# Patient Record
Sex: Female | Born: 1950
Health system: Southern US, Community
[De-identification: ages and names within clinical notes are randomized; demographics above are authoritative.]

## PROBLEM LIST (undated history)

## (undated) DIAGNOSIS — T7840XA Allergy, unspecified, initial encounter: Secondary | ICD-10-CM

## (undated) DIAGNOSIS — E785 Hyperlipidemia, unspecified: Secondary | ICD-10-CM

## (undated) HISTORY — PX: TUBAL LIGATION: SHX77

## (undated) HISTORY — PX: TOTAL ABDOMINAL HYSTERECTOMY: SHX209

## (undated) HISTORY — PX: EYE SURGERY: SHX253

## (undated) HISTORY — DX: Hyperlipidemia, unspecified: E78.5

## (undated) HISTORY — PX: COLONOSCOPY: SHX174

## (undated) HISTORY — DX: Allergy, unspecified, initial encounter: T78.40XA

---

## 1999-02-23 ENCOUNTER — Emergency Department (HOSPITAL_COMMUNITY): Admission: EM | Admit: 1999-02-23 | Discharge: 1999-02-23 | Payer: Self-pay | Admitting: Emergency Medicine

## 1999-02-23 ENCOUNTER — Encounter: Payer: Self-pay | Admitting: Emergency Medicine

## 1999-02-24 ENCOUNTER — Encounter: Payer: Self-pay | Admitting: Emergency Medicine

## 1999-02-24 ENCOUNTER — Ambulatory Visit (HOSPITAL_COMMUNITY): Admission: RE | Admit: 1999-02-24 | Discharge: 1999-02-24 | Payer: Self-pay | Admitting: Emergency Medicine

## 1999-02-24 ENCOUNTER — Emergency Department (HOSPITAL_COMMUNITY): Admission: EM | Admit: 1999-02-24 | Discharge: 1999-02-24 | Payer: Self-pay | Admitting: Emergency Medicine

## 1999-12-16 ENCOUNTER — Other Ambulatory Visit: Admission: RE | Admit: 1999-12-16 | Discharge: 1999-12-16 | Payer: Self-pay | Admitting: Obstetrics and Gynecology

## 2001-03-30 ENCOUNTER — Other Ambulatory Visit: Admission: RE | Admit: 2001-03-30 | Discharge: 2001-03-30 | Payer: Self-pay | Admitting: Obstetrics and Gynecology

## 2004-09-25 ENCOUNTER — Emergency Department (HOSPITAL_COMMUNITY): Admission: EM | Admit: 2004-09-25 | Discharge: 2004-09-25 | Payer: Self-pay | Admitting: Emergency Medicine

## 2005-08-09 ENCOUNTER — Emergency Department (HOSPITAL_COMMUNITY): Admission: EM | Admit: 2005-08-09 | Discharge: 2005-08-09 | Payer: Self-pay | Admitting: Family Medicine

## 2005-12-22 ENCOUNTER — Ambulatory Visit: Payer: Self-pay | Admitting: Gastroenterology

## 2006-01-04 ENCOUNTER — Ambulatory Visit: Payer: Self-pay | Admitting: Gastroenterology

## 2006-01-04 ENCOUNTER — Encounter (INDEPENDENT_AMBULATORY_CARE_PROVIDER_SITE_OTHER): Payer: Self-pay | Admitting: *Deleted

## 2006-12-30 ENCOUNTER — Encounter: Admission: RE | Admit: 2006-12-30 | Discharge: 2006-12-30 | Payer: Self-pay | Admitting: Internal Medicine

## 2007-06-02 ENCOUNTER — Emergency Department (HOSPITAL_COMMUNITY): Admission: EM | Admit: 2007-06-02 | Discharge: 2007-06-02 | Payer: Self-pay | Admitting: Family Medicine

## 2008-01-03 ENCOUNTER — Emergency Department (HOSPITAL_COMMUNITY): Admission: EM | Admit: 2008-01-03 | Discharge: 2008-01-03 | Payer: Self-pay | Admitting: Family Medicine

## 2008-01-04 ENCOUNTER — Emergency Department (HOSPITAL_COMMUNITY): Admission: EM | Admit: 2008-01-04 | Discharge: 2008-01-04 | Payer: Self-pay | Admitting: Emergency Medicine

## 2008-05-28 ENCOUNTER — Encounter: Admission: RE | Admit: 2008-05-28 | Discharge: 2008-05-28 | Payer: Self-pay | Admitting: Internal Medicine

## 2009-06-03 ENCOUNTER — Encounter: Admission: RE | Admit: 2009-06-03 | Discharge: 2009-06-03 | Payer: Self-pay | Admitting: Internal Medicine

## 2010-06-04 ENCOUNTER — Encounter: Admission: RE | Admit: 2010-06-04 | Discharge: 2010-06-04 | Payer: Self-pay | Admitting: Internal Medicine

## 2011-02-10 ENCOUNTER — Encounter: Payer: Self-pay | Admitting: Gastroenterology

## 2011-05-18 ENCOUNTER — Other Ambulatory Visit: Payer: Self-pay | Admitting: Internal Medicine

## 2011-05-18 DIAGNOSIS — Z1231 Encounter for screening mammogram for malignant neoplasm of breast: Secondary | ICD-10-CM

## 2011-06-09 ENCOUNTER — Ambulatory Visit
Admission: RE | Admit: 2011-06-09 | Discharge: 2011-06-09 | Disposition: A | Discharge status: Home / Self Care | Payer: Managed Care, Other (non HMO) | Source: Ambulatory Visit | Attending: Internal Medicine | Admitting: Internal Medicine

## 2011-06-09 DIAGNOSIS — Z1231 Encounter for screening mammogram for malignant neoplasm of breast: Secondary | ICD-10-CM

## 2012-08-01 ENCOUNTER — Other Ambulatory Visit: Payer: Self-pay | Admitting: Internal Medicine

## 2012-08-01 DIAGNOSIS — Z1231 Encounter for screening mammogram for malignant neoplasm of breast: Secondary | ICD-10-CM

## 2012-08-18 ENCOUNTER — Ambulatory Visit
Admission: RE | Admit: 2012-08-18 | Discharge: 2012-08-18 | Disposition: A | Discharge status: Home / Self Care | Payer: 59 | Source: Ambulatory Visit | Attending: Internal Medicine | Admitting: Internal Medicine

## 2012-08-18 DIAGNOSIS — Z1231 Encounter for screening mammogram for malignant neoplasm of breast: Secondary | ICD-10-CM

## 2013-09-29 ENCOUNTER — Other Ambulatory Visit: Payer: Self-pay

## 2013-09-29 DIAGNOSIS — Z1231 Encounter for screening mammogram for malignant neoplasm of breast: Secondary | ICD-10-CM

## 2013-10-13 ENCOUNTER — Ambulatory Visit
Admission: RE | Admit: 2013-10-13 | Discharge: 2013-10-13 | Disposition: A | Discharge status: Home / Self Care | Payer: 59 | Source: Ambulatory Visit

## 2013-10-13 DIAGNOSIS — Z1231 Encounter for screening mammogram for malignant neoplasm of breast: Secondary | ICD-10-CM

## 2014-11-23 ENCOUNTER — Other Ambulatory Visit: Payer: Self-pay

## 2014-11-23 DIAGNOSIS — Z1231 Encounter for screening mammogram for malignant neoplasm of breast: Secondary | ICD-10-CM

## 2014-11-28 ENCOUNTER — Ambulatory Visit
Admission: RE | Admit: 2014-11-28 | Discharge: 2014-11-28 | Disposition: A | Discharge status: Home / Self Care | Payer: 59 | Source: Ambulatory Visit

## 2014-11-28 DIAGNOSIS — Z1231 Encounter for screening mammogram for malignant neoplasm of breast: Secondary | ICD-10-CM

## 2015-12-11 ENCOUNTER — Encounter: Payer: Self-pay | Admitting: Gastroenterology

## 2015-12-16 ENCOUNTER — Other Ambulatory Visit: Payer: Self-pay

## 2015-12-16 DIAGNOSIS — Z1231 Encounter for screening mammogram for malignant neoplasm of breast: Secondary | ICD-10-CM

## 2015-12-24 ENCOUNTER — Ambulatory Visit
Admission: RE | Admit: 2015-12-24 | Discharge: 2015-12-24 | Disposition: A | Discharge status: Home / Self Care | Payer: 59 | Source: Ambulatory Visit

## 2015-12-24 DIAGNOSIS — Z1231 Encounter for screening mammogram for malignant neoplasm of breast: Secondary | ICD-10-CM

## 2016-01-10 ENCOUNTER — Encounter: Payer: Self-pay | Admitting: Gastroenterology

## 2016-03-09 ENCOUNTER — Ambulatory Visit (AMBULATORY_SURGERY_CENTER): Payer: Self-pay

## 2016-03-09 ENCOUNTER — Encounter: Payer: Self-pay | Admitting: Gastroenterology

## 2016-03-09 ENCOUNTER — Encounter (INDEPENDENT_AMBULATORY_CARE_PROVIDER_SITE_OTHER): Payer: Self-pay

## 2016-03-09 VITALS — Ht 60.0 in | Wt 116.2 lb

## 2016-03-09 DIAGNOSIS — Z1211 Encounter for screening for malignant neoplasm of colon: Secondary | ICD-10-CM

## 2016-03-09 MED ORDER — SUPREP BOWEL PREP KIT 17.5-3.13-1.6 GM/177ML PO SOLN: 1.0000 | Freq: Once | ORAL | 0 refills | Status: AC

## 2016-03-23 ENCOUNTER — Encounter: Payer: Self-pay | Admitting: Gastroenterology

## 2016-03-23 ENCOUNTER — Ambulatory Visit (AMBULATORY_SURGERY_CENTER): Payer: 59 | Admitting: Gastroenterology

## 2016-03-23 VITALS — BP 123/47 | HR 76 | Temp 98.0°F | Resp 18 | Ht 60.0 in | Wt 116.0 lb

## 2016-03-23 DIAGNOSIS — Z1211 Encounter for screening for malignant neoplasm of colon: Secondary | ICD-10-CM

## 2016-03-23 MED ORDER — SODIUM CHLORIDE 0.9 % IV SOLN: 500.0000 mL | INTRAVENOUS | Status: AC

## 2016-03-23 NOTE — Op Note (Signed)
[facility] Patient Name: Tiffany Simpson Procedure Date: 03/23/2016 9:27 AM MRN: [id number] Endoscopist: Sherilyn Cooter L. Myrtie Neither , MD Age: 65 Referring MD:  Date of Birth: Oct 16, 1950 Gender: Female Account #: [id number] Procedure:                Colonoscopy Indications:              Screening for colorectal malignant neoplasm Medicines:                Monitored Anesthesia Care Procedure:                Pre-Anesthesia Assessment:                           - Prior to the procedure, a History and Physical                            was performed, and patient medications and                            allergies were reviewed. The patient's tolerance of                            previous anesthesia was also reviewed. The risks                            and benefits of the procedure and the sedation                            options and risks were discussed with the patient.                            All questions were answered, and informed consent                            was obtained. Prior Anticoagulants: The patient has                            taken no previous anticoagulant or antiplatelet                            agents. ASA Grade Assessment: II - A patient with                            mild systemic disease. After reviewing the risks                            and benefits, the patient was deemed in                            satisfactory condition to undergo the procedure.                           After obtaining informed consent, the colonoscope  was passed under direct vision. Throughout the                            procedure, the patient's blood pressure, pulse, and                            oxygen saturations were monitored continuously. The                            Model PCF-H190DL [id number]) scope was introduced                            through the anus and advanced to the the cecum,                            identified by  appendiceal orifice and ileocecal                            valve. The colonoscopy was somewhat difficult due                            to a tortuous colon. The patient tolerated the                            procedure well. The quality of the bowel                            preparation was excellent. The ileocecal valve,                            appendiceal orifice, and rectum were photographed.                            The quality of the bowel preparation was evaluated                            using the BBPS [facility] Bowel Preparation Scale)                            with scores of: Right Colon = 3, Transverse Colon =                            3 and Left Colon = 3 (entire mucosa seen well with                            no residual staining, small fragments of stool or                            opaque liquid). The total BBPS score equals 9. The                            bowel preparation used was SUPREP. Scope In: 9:45:56 AM Scope Out: 9:58:25  AM Scope Withdrawal Time: 0 hours 7 minutes 14 seconds  Total Procedure Duration: 0 hours 12 minutes 29 seconds  Findings:                 The perianal and digital rectal examinations were                            normal.                           Multiple small-mouthed diverticula were found in                            the left colon.                           Internal hemorrhoids were found during anoscopy.                            The hemorrhoids were small and Grade I (internal                            hemorrhoids that do not prolapse).                           Retroflexion in the rectum was not performed due to                            anatomy.                           The exam was otherwise without abnormality. Complications:            No immediate complications. Estimated Blood Loss:     Estimated blood loss: none. Impression:               - Diverticulosis in the left colon.                           - Internal  hemorrhoids.                           - The examination was otherwise normal.                           - No specimens collected. Recommendation:           - Patient has a contact number available for                            emergencies. The signs and symptoms of potential                            delayed complications were discussed with the                            patient. Return to normal activities tomorrow.  Written discharge instructions were provided to the                            patient.                           - Resume previous diet.                           - Continue present medications.                           - Repeat colonoscopy in 10 years for screening                            purposes. Henry L. Myrtie Neitheranis, MD 03/23/2016 10:02:20 AM This report has been signed electronically.

## 2016-03-23 NOTE — Progress Notes (Signed)
A/ox3, pleased with MAC, report to RN 

## 2016-03-23 NOTE — Patient Instructions (Signed)
Impression/recommendations:  Diverticulosis (handout given) High Fiber diet (handout given) Hemorrhoids (handout given)  YOU HAD AN ENDOSCOPIC PROCEDURE TODAY AT THE [facility]:   Refer to the procedure report that was given to you for any specific questions about what was found during the examination.  If the procedure report does not answer your questions, please call your gastroenterologist to clarify.  If you requested that your care partner not be given the details of your procedure findings, then the procedure report has been included in a sealed envelope for you to review at your convenience later.  YOU SHOULD EXPECT: Some feelings of bloating in the abdomen. Passage of more gas than usual.  Walking can help get rid of the air that was put into your GI tract during the procedure and reduce the bloating. If you had a lower endoscopy (such as a colonoscopy or flexible sigmoidoscopy) you may notice spotting of blood in your stool or on the toilet paper. If you underwent a bowel prep for your procedure, you may not have a normal bowel movement for a few days.  Please Note:  You might notice some irritation and congestion in your nose or some drainage.  This is from the oxygen used during your procedure.  There is no need for concern and it should clear up in a day or so.  SYMPTOMS TO REPORT IMMEDIATELY:   Following lower endoscopy (colonoscopy or flexible sigmoidoscopy):  Excessive amounts of blood in the stool  Significant tenderness or worsening of abdominal pains  Swelling of the abdomen that is new, acute  Fever of 100F or higher  For urgent or emergent issues, a gastroenterologist can be reached at any hour by calling [phone].   DIET:  We do recommend a small meal at first, but then you may proceed to your regular diet.  Drink plenty of fluids but you should avoid alcoholic beverages for 24 hours.  ACTIVITY:  You should plan to take it easy for the rest of  today and you should NOT DRIVE or use heavy machinery until tomorrow (because of the sedation medicines used during the test).    FOLLOW UP: Our staff will call the number listed on your records the next business day following your procedure to check on you and address any questions or concerns that you may have regarding the information given to you following your procedure. If we do not reach you, we will leave a message.  However, if you are feeling well and you are not experiencing any problems, there is no need to return our call.  We will assume that you have returned to your regular daily activities without incident.  If any biopsies were taken you will be contacted by phone or by letter within the next 1-3 weeks.  Please call us at [phone] if you have not heard about the biopsies in 3 weeks.    SIGNATURES/CONFIDENTIALITY: You and/or your care partner have signed paperwork which will be entered into your electronic medical record.  These signatures attest to the fact that that the information above on your After Visit Summary has been reviewed and is understood.  Full responsibility of the confidentiality of this discharge information lies with you and/or your care-partner. 

## 2016-03-24 ENCOUNTER — Telehealth: Payer: Self-pay

## 2016-03-24 NOTE — Telephone Encounter (Signed)
  Follow up Call-  Call back number 03/23/2016  Post procedure Call Back phone  # [phone]  Permission to leave phone message Yes  Some recent data might be hidden     Patient questions:  Do you have a fever, pain , or abdominal swelling? Yes.   Pain Score  0 *  Have you tolerated food without any problems? Yes.    Have you been able to return to your normal activities? Yes.    Do you have any questions about your discharge instructions: Diet   No. Medications  No. Follow up visit  No.  Do you have questions or concerns about your Care? No.  Actions: * If pain score is 4 or above: No action needed, pain <4.

## 2016-04-10 DIAGNOSIS — M810 Age-related osteoporosis without current pathological fracture: Secondary | ICD-10-CM | POA: Diagnosis not present

## 2016-05-05 DIAGNOSIS — R262 Difficulty in walking, not elsewhere classified: Secondary | ICD-10-CM | POA: Diagnosis not present

## 2016-05-05 DIAGNOSIS — M25562 Pain in left knee: Secondary | ICD-10-CM | POA: Diagnosis not present

## 2016-05-05 DIAGNOSIS — M1712 Unilateral primary osteoarthritis, left knee: Secondary | ICD-10-CM | POA: Diagnosis not present

## 2016-05-13 DIAGNOSIS — M1712 Unilateral primary osteoarthritis, left knee: Secondary | ICD-10-CM | POA: Diagnosis not present

## 2016-05-13 DIAGNOSIS — M25562 Pain in left knee: Secondary | ICD-10-CM | POA: Diagnosis not present

## 2016-05-15 DIAGNOSIS — N390 Urinary tract infection, site not specified: Secondary | ICD-10-CM | POA: Diagnosis not present

## 2016-05-15 DIAGNOSIS — M810 Age-related osteoporosis without current pathological fracture: Secondary | ICD-10-CM | POA: Diagnosis not present

## 2016-05-15 DIAGNOSIS — R8299 Other abnormal findings in urine: Secondary | ICD-10-CM | POA: Diagnosis not present

## 2016-05-15 DIAGNOSIS — E784 Other hyperlipidemia: Secondary | ICD-10-CM | POA: Diagnosis not present

## 2016-05-20 DIAGNOSIS — M1712 Unilateral primary osteoarthritis, left knee: Secondary | ICD-10-CM | POA: Diagnosis not present

## 2016-05-20 DIAGNOSIS — M25562 Pain in left knee: Secondary | ICD-10-CM | POA: Diagnosis not present

## 2016-05-22 DIAGNOSIS — F17210 Nicotine dependence, cigarettes, uncomplicated: Secondary | ICD-10-CM | POA: Diagnosis not present

## 2016-05-22 DIAGNOSIS — E785 Hyperlipidemia, unspecified: Secondary | ICD-10-CM | POA: Diagnosis not present

## 2016-05-22 DIAGNOSIS — Z0000 Encounter for general adult medical examination without abnormal findings: Secondary | ICD-10-CM | POA: Diagnosis not present

## 2016-05-22 DIAGNOSIS — Z6822 Body mass index (BMI) 22.0-22.9, adult: Secondary | ICD-10-CM | POA: Diagnosis not present

## 2016-05-22 DIAGNOSIS — R030 Elevated blood-pressure reading, without diagnosis of hypertension: Secondary | ICD-10-CM | POA: Diagnosis not present

## 2016-05-22 DIAGNOSIS — M1990 Unspecified osteoarthritis, unspecified site: Secondary | ICD-10-CM | POA: Diagnosis not present

## 2016-05-22 DIAGNOSIS — M810 Age-related osteoporosis without current pathological fracture: Secondary | ICD-10-CM | POA: Diagnosis not present

## 2016-05-22 DIAGNOSIS — J449 Chronic obstructive pulmonary disease, unspecified: Secondary | ICD-10-CM | POA: Diagnosis not present

## 2016-05-22 DIAGNOSIS — Z23 Encounter for immunization: Secondary | ICD-10-CM | POA: Diagnosis not present

## 2016-05-27 DIAGNOSIS — M1712 Unilateral primary osteoarthritis, left knee: Secondary | ICD-10-CM | POA: Diagnosis not present

## 2016-05-27 DIAGNOSIS — M25562 Pain in left knee: Secondary | ICD-10-CM | POA: Diagnosis not present

## 2016-05-28 DIAGNOSIS — Z1212 Encounter for screening for malignant neoplasm of rectum: Secondary | ICD-10-CM | POA: Diagnosis not present

## 2016-06-03 DIAGNOSIS — M1712 Unilateral primary osteoarthritis, left knee: Secondary | ICD-10-CM | POA: Diagnosis not present

## 2016-06-03 DIAGNOSIS — M25562 Pain in left knee: Secondary | ICD-10-CM | POA: Diagnosis not present

## 2016-09-16 DIAGNOSIS — M25562 Pain in left knee: Secondary | ICD-10-CM | POA: Diagnosis not present

## 2016-09-16 DIAGNOSIS — M1712 Unilateral primary osteoarthritis, left knee: Secondary | ICD-10-CM | POA: Diagnosis not present

## 2016-11-27 DIAGNOSIS — M1990 Unspecified osteoarthritis, unspecified site: Secondary | ICD-10-CM | POA: Diagnosis not present

## 2016-11-27 DIAGNOSIS — R030 Elevated blood-pressure reading, without diagnosis of hypertension: Secondary | ICD-10-CM | POA: Diagnosis not present

## 2016-11-27 DIAGNOSIS — E784 Other hyperlipidemia: Secondary | ICD-10-CM | POA: Diagnosis not present

## 2016-11-27 DIAGNOSIS — Z6823 Body mass index (BMI) 23.0-23.9, adult: Secondary | ICD-10-CM | POA: Diagnosis not present

## 2016-11-27 DIAGNOSIS — J449 Chronic obstructive pulmonary disease, unspecified: Secondary | ICD-10-CM | POA: Diagnosis not present

## 2016-12-02 ENCOUNTER — Other Ambulatory Visit: Payer: Self-pay | Admitting: Internal Medicine

## 2016-12-02 DIAGNOSIS — Z1231 Encounter for screening mammogram for malignant neoplasm of breast: Secondary | ICD-10-CM

## 2016-12-23 DIAGNOSIS — M1712 Unilateral primary osteoarthritis, left knee: Secondary | ICD-10-CM | POA: Diagnosis not present

## 2016-12-23 DIAGNOSIS — M25562 Pain in left knee: Secondary | ICD-10-CM | POA: Diagnosis not present

## 2016-12-28 ENCOUNTER — Ambulatory Visit
Admission: RE | Admit: 2016-12-28 | Discharge: 2016-12-28 | Disposition: A | Discharge status: Home / Self Care | Payer: Medicare Other | Source: Ambulatory Visit | Attending: Internal Medicine | Admitting: Internal Medicine

## 2016-12-28 DIAGNOSIS — Z1231 Encounter for screening mammogram for malignant neoplasm of breast: Secondary | ICD-10-CM

## 2016-12-30 DIAGNOSIS — M1712 Unilateral primary osteoarthritis, left knee: Secondary | ICD-10-CM | POA: Diagnosis not present

## 2016-12-30 DIAGNOSIS — M25562 Pain in left knee: Secondary | ICD-10-CM | POA: Diagnosis not present

## 2017-01-06 DIAGNOSIS — M25562 Pain in left knee: Secondary | ICD-10-CM | POA: Diagnosis not present

## 2017-01-06 DIAGNOSIS — M1712 Unilateral primary osteoarthritis, left knee: Secondary | ICD-10-CM | POA: Diagnosis not present

## 2017-01-11 DIAGNOSIS — M25562 Pain in left knee: Secondary | ICD-10-CM | POA: Diagnosis not present

## 2017-01-11 DIAGNOSIS — M1712 Unilateral primary osteoarthritis, left knee: Secondary | ICD-10-CM | POA: Diagnosis not present

## 2017-01-20 DIAGNOSIS — M1712 Unilateral primary osteoarthritis, left knee: Secondary | ICD-10-CM | POA: Diagnosis not present

## 2017-01-20 DIAGNOSIS — M25562 Pain in left knee: Secondary | ICD-10-CM | POA: Diagnosis not present

## 2017-05-21 DIAGNOSIS — M810 Age-related osteoporosis without current pathological fracture: Secondary | ICD-10-CM | POA: Diagnosis not present

## 2017-05-21 DIAGNOSIS — R82998 Other abnormal findings in urine: Secondary | ICD-10-CM | POA: Diagnosis not present

## 2017-05-21 DIAGNOSIS — E7849 Other hyperlipidemia: Secondary | ICD-10-CM | POA: Diagnosis not present

## 2017-05-28 DIAGNOSIS — Z23 Encounter for immunization: Secondary | ICD-10-CM | POA: Diagnosis not present

## 2017-05-28 DIAGNOSIS — Z0000 Encounter for general adult medical examination without abnormal findings: Secondary | ICD-10-CM | POA: Diagnosis not present

## 2017-05-28 DIAGNOSIS — K635 Polyp of colon: Secondary | ICD-10-CM | POA: Diagnosis not present

## 2017-05-28 DIAGNOSIS — Z1389 Encounter for screening for other disorder: Secondary | ICD-10-CM | POA: Diagnosis not present

## 2017-05-28 DIAGNOSIS — M810 Age-related osteoporosis without current pathological fracture: Secondary | ICD-10-CM | POA: Diagnosis not present

## 2017-05-28 DIAGNOSIS — L821 Other seborrheic keratosis: Secondary | ICD-10-CM | POA: Diagnosis not present

## 2017-05-28 DIAGNOSIS — E7849 Other hyperlipidemia: Secondary | ICD-10-CM | POA: Diagnosis not present

## 2017-05-28 DIAGNOSIS — J3089 Other allergic rhinitis: Secondary | ICD-10-CM | POA: Diagnosis not present

## 2017-05-28 DIAGNOSIS — R030 Elevated blood-pressure reading, without diagnosis of hypertension: Secondary | ICD-10-CM | POA: Diagnosis not present

## 2017-05-28 DIAGNOSIS — M1990 Unspecified osteoarthritis, unspecified site: Secondary | ICD-10-CM | POA: Diagnosis not present

## 2017-05-28 DIAGNOSIS — J449 Chronic obstructive pulmonary disease, unspecified: Secondary | ICD-10-CM | POA: Diagnosis not present

## 2017-06-01 DIAGNOSIS — Z1212 Encounter for screening for malignant neoplasm of rectum: Secondary | ICD-10-CM | POA: Diagnosis not present

## 2017-06-23 DIAGNOSIS — Z23 Encounter for immunization: Secondary | ICD-10-CM | POA: Diagnosis not present

## 2017-12-17 ENCOUNTER — Other Ambulatory Visit: Payer: Self-pay | Admitting: Internal Medicine

## 2017-12-17 DIAGNOSIS — J449 Chronic obstructive pulmonary disease, unspecified: Secondary | ICD-10-CM | POA: Diagnosis not present

## 2017-12-17 DIAGNOSIS — Z1231 Encounter for screening mammogram for malignant neoplasm of breast: Secondary | ICD-10-CM

## 2017-12-17 DIAGNOSIS — M810 Age-related osteoporosis without current pathological fracture: Secondary | ICD-10-CM | POA: Diagnosis not present

## 2017-12-17 DIAGNOSIS — Z6823 Body mass index (BMI) 23.0-23.9, adult: Secondary | ICD-10-CM | POA: Diagnosis not present

## 2017-12-17 DIAGNOSIS — E7849 Other hyperlipidemia: Secondary | ICD-10-CM | POA: Diagnosis not present

## 2017-12-17 DIAGNOSIS — R030 Elevated blood-pressure reading, without diagnosis of hypertension: Secondary | ICD-10-CM | POA: Diagnosis not present

## 2017-12-17 DIAGNOSIS — Z1389 Encounter for screening for other disorder: Secondary | ICD-10-CM | POA: Diagnosis not present

## 2017-12-17 DIAGNOSIS — F418 Other specified anxiety disorders: Secondary | ICD-10-CM | POA: Diagnosis not present

## 2017-12-18 ENCOUNTER — Other Ambulatory Visit: Payer: Self-pay | Admitting: Internal Medicine

## 2017-12-18 DIAGNOSIS — E785 Hyperlipidemia, unspecified: Secondary | ICD-10-CM

## 2017-12-31 ENCOUNTER — Ambulatory Visit
Admission: RE | Admit: 2017-12-31 | Discharge: 2017-12-31 | Disposition: A | Discharge status: Home / Self Care | Payer: Medicare Other | Source: Ambulatory Visit | Attending: Internal Medicine | Admitting: Internal Medicine

## 2017-12-31 DIAGNOSIS — E785 Hyperlipidemia, unspecified: Secondary | ICD-10-CM

## 2018-01-14 ENCOUNTER — Ambulatory Visit
Admission: RE | Admit: 2018-01-14 | Discharge: 2018-01-14 | Disposition: A | Discharge status: Home / Self Care | Payer: Medicare Other | Source: Ambulatory Visit | Attending: Internal Medicine | Admitting: Internal Medicine

## 2018-01-14 DIAGNOSIS — Z1231 Encounter for screening mammogram for malignant neoplasm of breast: Secondary | ICD-10-CM | POA: Diagnosis not present

## 2018-02-16 DIAGNOSIS — M1712 Unilateral primary osteoarthritis, left knee: Secondary | ICD-10-CM | POA: Diagnosis not present

## 2018-02-16 DIAGNOSIS — M25562 Pain in left knee: Secondary | ICD-10-CM | POA: Diagnosis not present

## 2018-02-23 DIAGNOSIS — M25562 Pain in left knee: Secondary | ICD-10-CM | POA: Diagnosis not present

## 2018-02-23 DIAGNOSIS — M1712 Unilateral primary osteoarthritis, left knee: Secondary | ICD-10-CM | POA: Diagnosis not present

## 2018-02-28 DIAGNOSIS — M25562 Pain in left knee: Secondary | ICD-10-CM | POA: Diagnosis not present

## 2018-02-28 DIAGNOSIS — M1712 Unilateral primary osteoarthritis, left knee: Secondary | ICD-10-CM | POA: Diagnosis not present

## 2018-03-07 DIAGNOSIS — M1712 Unilateral primary osteoarthritis, left knee: Secondary | ICD-10-CM | POA: Diagnosis not present

## 2018-03-07 DIAGNOSIS — M25562 Pain in left knee: Secondary | ICD-10-CM | POA: Diagnosis not present

## 2018-03-14 DIAGNOSIS — M1712 Unilateral primary osteoarthritis, left knee: Secondary | ICD-10-CM | POA: Diagnosis not present

## 2018-03-14 DIAGNOSIS — M25562 Pain in left knee: Secondary | ICD-10-CM | POA: Diagnosis not present

## 2018-03-30 DIAGNOSIS — M25562 Pain in left knee: Secondary | ICD-10-CM | POA: Diagnosis not present

## 2018-04-26 DIAGNOSIS — H43393 Other vitreous opacities, bilateral: Secondary | ICD-10-CM | POA: Diagnosis not present

## 2018-04-29 DIAGNOSIS — H43812 Vitreous degeneration, left eye: Secondary | ICD-10-CM | POA: Diagnosis not present

## 2018-04-29 DIAGNOSIS — H3562 Retinal hemorrhage, left eye: Secondary | ICD-10-CM | POA: Diagnosis not present

## 2018-04-29 DIAGNOSIS — H2513 Age-related nuclear cataract, bilateral: Secondary | ICD-10-CM | POA: Diagnosis not present

## 2018-05-16 DIAGNOSIS — H3562 Retinal hemorrhage, left eye: Secondary | ICD-10-CM | POA: Diagnosis not present

## 2018-05-16 DIAGNOSIS — H43812 Vitreous degeneration, left eye: Secondary | ICD-10-CM | POA: Diagnosis not present

## 2018-05-16 DIAGNOSIS — H2513 Age-related nuclear cataract, bilateral: Secondary | ICD-10-CM | POA: Diagnosis not present

## 2018-05-25 DIAGNOSIS — M810 Age-related osteoporosis without current pathological fracture: Secondary | ICD-10-CM | POA: Diagnosis not present

## 2018-05-25 DIAGNOSIS — E7849 Other hyperlipidemia: Secondary | ICD-10-CM | POA: Diagnosis not present

## 2018-05-25 DIAGNOSIS — R82998 Other abnormal findings in urine: Secondary | ICD-10-CM | POA: Diagnosis not present

## 2018-06-08 DIAGNOSIS — M810 Age-related osteoporosis without current pathological fracture: Secondary | ICD-10-CM | POA: Diagnosis not present

## 2018-06-08 DIAGNOSIS — Z0000 Encounter for general adult medical examination without abnormal findings: Secondary | ICD-10-CM | POA: Diagnosis not present

## 2018-06-08 DIAGNOSIS — Z6823 Body mass index (BMI) 23.0-23.9, adult: Secondary | ICD-10-CM | POA: Diagnosis not present

## 2018-06-08 DIAGNOSIS — J449 Chronic obstructive pulmonary disease, unspecified: Secondary | ICD-10-CM | POA: Diagnosis not present

## 2018-06-08 DIAGNOSIS — R030 Elevated blood-pressure reading, without diagnosis of hypertension: Secondary | ICD-10-CM | POA: Diagnosis not present

## 2018-06-08 DIAGNOSIS — E7849 Other hyperlipidemia: Secondary | ICD-10-CM | POA: Diagnosis not present

## 2018-06-08 DIAGNOSIS — F418 Other specified anxiety disorders: Secondary | ICD-10-CM | POA: Diagnosis not present

## 2018-06-08 DIAGNOSIS — Z1389 Encounter for screening for other disorder: Secondary | ICD-10-CM | POA: Diagnosis not present

## 2018-06-08 DIAGNOSIS — L821 Other seborrheic keratosis: Secondary | ICD-10-CM | POA: Diagnosis not present

## 2018-06-08 DIAGNOSIS — M1990 Unspecified osteoarthritis, unspecified site: Secondary | ICD-10-CM | POA: Diagnosis not present

## 2018-06-08 DIAGNOSIS — I700 Atherosclerosis of aorta: Secondary | ICD-10-CM | POA: Diagnosis not present

## 2018-06-08 DIAGNOSIS — Z23 Encounter for immunization: Secondary | ICD-10-CM | POA: Diagnosis not present

## 2018-06-08 DIAGNOSIS — F17210 Nicotine dependence, cigarettes, uncomplicated: Secondary | ICD-10-CM | POA: Diagnosis not present

## 2018-06-10 DIAGNOSIS — Z1212 Encounter for screening for malignant neoplasm of rectum: Secondary | ICD-10-CM | POA: Diagnosis not present

## 2018-06-27 DIAGNOSIS — H2513 Age-related nuclear cataract, bilateral: Secondary | ICD-10-CM | POA: Diagnosis not present

## 2018-06-27 DIAGNOSIS — H3562 Retinal hemorrhage, left eye: Secondary | ICD-10-CM | POA: Diagnosis not present

## 2018-06-27 DIAGNOSIS — H43812 Vitreous degeneration, left eye: Secondary | ICD-10-CM | POA: Diagnosis not present

## 2018-07-25 DIAGNOSIS — H40033 Anatomical narrow angle, bilateral: Secondary | ICD-10-CM | POA: Diagnosis not present

## 2018-07-25 DIAGNOSIS — H2513 Age-related nuclear cataract, bilateral: Secondary | ICD-10-CM | POA: Diagnosis not present

## 2018-08-08 DIAGNOSIS — H43812 Vitreous degeneration, left eye: Secondary | ICD-10-CM | POA: Diagnosis not present

## 2018-08-08 DIAGNOSIS — H3562 Retinal hemorrhage, left eye: Secondary | ICD-10-CM | POA: Diagnosis not present

## 2018-08-08 DIAGNOSIS — H2513 Age-related nuclear cataract, bilateral: Secondary | ICD-10-CM | POA: Diagnosis not present

## 2018-09-02 DIAGNOSIS — H40033 Anatomical narrow angle, bilateral: Secondary | ICD-10-CM | POA: Diagnosis not present

## 2018-09-02 DIAGNOSIS — H2513 Age-related nuclear cataract, bilateral: Secondary | ICD-10-CM | POA: Diagnosis not present

## 2018-09-05 DIAGNOSIS — H04123 Dry eye syndrome of bilateral lacrimal glands: Secondary | ICD-10-CM | POA: Diagnosis not present

## 2018-09-13 DIAGNOSIS — H1013 Acute atopic conjunctivitis, bilateral: Secondary | ICD-10-CM | POA: Diagnosis not present

## 2019-01-03 DIAGNOSIS — H1013 Acute atopic conjunctivitis, bilateral: Secondary | ICD-10-CM | POA: Diagnosis not present

## 2019-01-09 ENCOUNTER — Other Ambulatory Visit: Payer: Self-pay | Admitting: Internal Medicine

## 2019-01-09 DIAGNOSIS — Z1231 Encounter for screening mammogram for malignant neoplasm of breast: Secondary | ICD-10-CM

## 2019-02-10 DIAGNOSIS — H04123 Dry eye syndrome of bilateral lacrimal glands: Secondary | ICD-10-CM | POA: Diagnosis not present

## 2019-02-23 ENCOUNTER — Ambulatory Visit
Admission: RE | Admit: 2019-02-23 | Discharge: 2019-02-23 | Disposition: A | Discharge status: Home / Self Care | Payer: Medicare Other | Source: Ambulatory Visit | Attending: Internal Medicine | Admitting: Internal Medicine

## 2019-02-23 ENCOUNTER — Other Ambulatory Visit: Payer: Self-pay

## 2019-02-23 DIAGNOSIS — Z1231 Encounter for screening mammogram for malignant neoplasm of breast: Secondary | ICD-10-CM

## 2019-06-05 DIAGNOSIS — M810 Age-related osteoporosis without current pathological fracture: Secondary | ICD-10-CM | POA: Diagnosis not present

## 2019-06-05 DIAGNOSIS — E7849 Other hyperlipidemia: Secondary | ICD-10-CM | POA: Diagnosis not present

## 2019-06-12 DIAGNOSIS — E785 Hyperlipidemia, unspecified: Secondary | ICD-10-CM | POA: Diagnosis not present

## 2019-06-12 DIAGNOSIS — M1990 Unspecified osteoarthritis, unspecified site: Secondary | ICD-10-CM | POA: Diagnosis not present

## 2019-06-12 DIAGNOSIS — J309 Allergic rhinitis, unspecified: Secondary | ICD-10-CM | POA: Diagnosis not present

## 2019-06-12 DIAGNOSIS — M810 Age-related osteoporosis without current pathological fracture: Secondary | ICD-10-CM | POA: Diagnosis not present

## 2019-06-12 DIAGNOSIS — Z0000 Encounter for general adult medical examination without abnormal findings: Secondary | ICD-10-CM | POA: Diagnosis not present

## 2019-06-12 DIAGNOSIS — R82998 Other abnormal findings in urine: Secondary | ICD-10-CM | POA: Diagnosis not present

## 2019-06-12 DIAGNOSIS — F418 Other specified anxiety disorders: Secondary | ICD-10-CM | POA: Diagnosis not present

## 2019-06-12 DIAGNOSIS — I700 Atherosclerosis of aorta: Secondary | ICD-10-CM | POA: Diagnosis not present

## 2019-06-12 DIAGNOSIS — L821 Other seborrheic keratosis: Secondary | ICD-10-CM | POA: Diagnosis not present

## 2019-06-12 DIAGNOSIS — Z23 Encounter for immunization: Secondary | ICD-10-CM | POA: Diagnosis not present

## 2019-06-12 DIAGNOSIS — K635 Polyp of colon: Secondary | ICD-10-CM | POA: Diagnosis not present

## 2019-06-12 DIAGNOSIS — J449 Chronic obstructive pulmonary disease, unspecified: Secondary | ICD-10-CM | POA: Diagnosis not present

## 2019-06-12 DIAGNOSIS — R030 Elevated blood-pressure reading, without diagnosis of hypertension: Secondary | ICD-10-CM | POA: Diagnosis not present

## 2019-11-18 IMAGING — MG DIGITAL SCREENING BILATERAL MAMMOGRAM WITH TOMO AND CAD
8 series · 9 of 24 positions shown · non-contrast
Comparison: Previous exam(s).

CLINICAL DATA: Screening.

EXAM:
DIGITAL SCREENING BILATERAL MAMMOGRAM WITH TOMO AND CAD

[L MLO synth-2D]
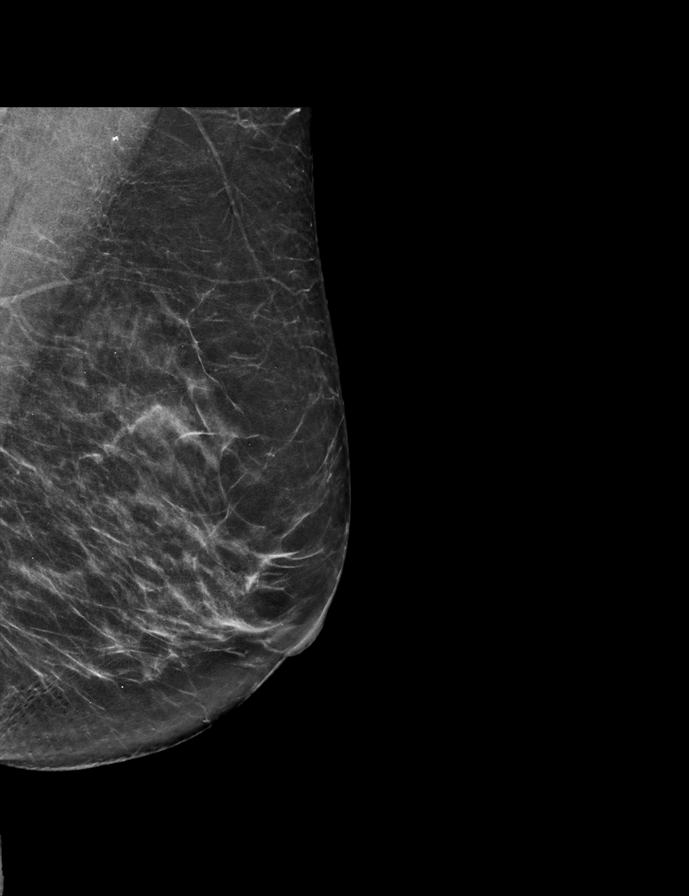

[L CC synth-2D]
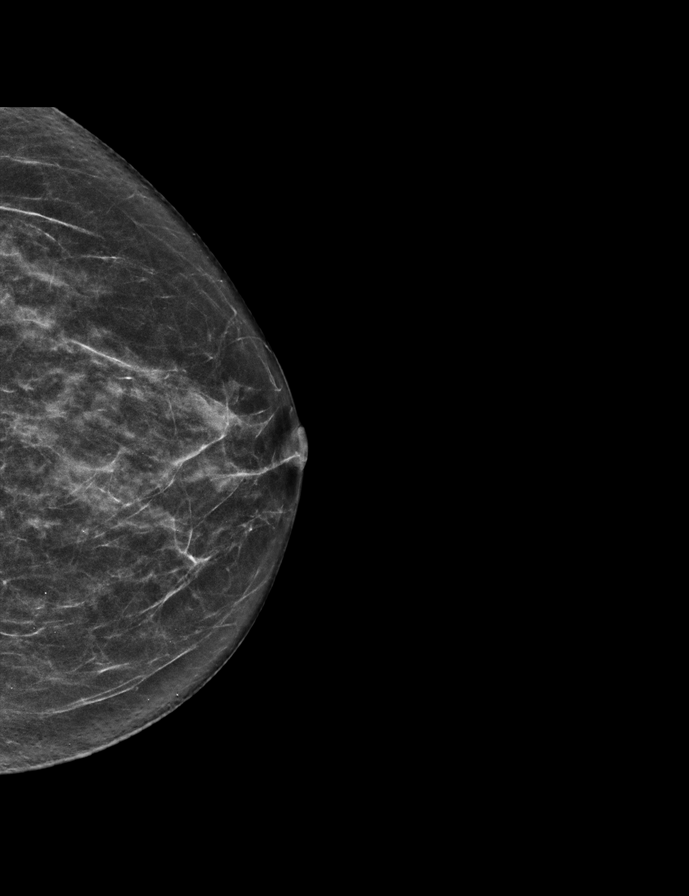

[R CC synth-2D]
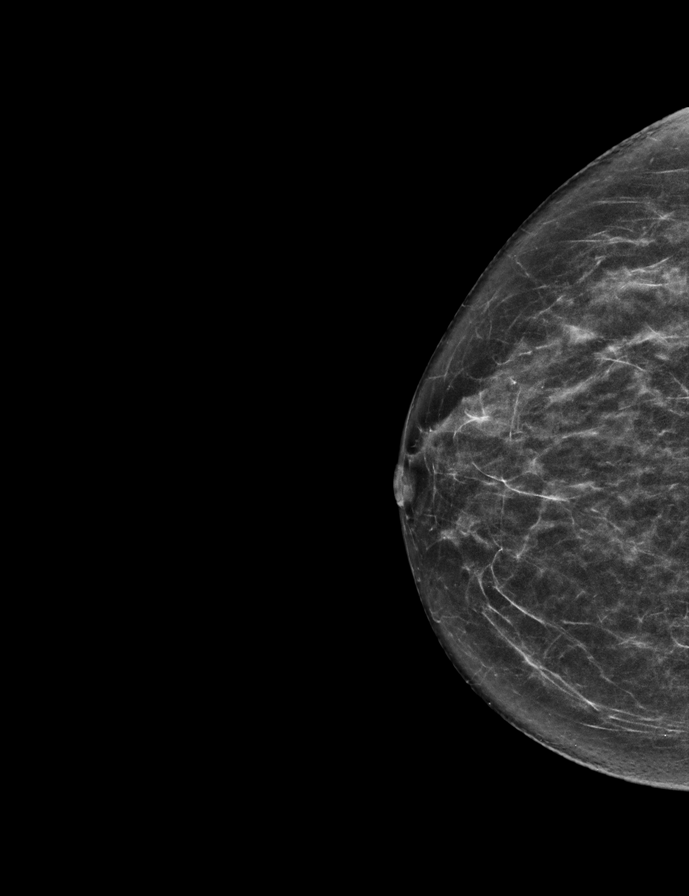

[R MLO synth-2D]
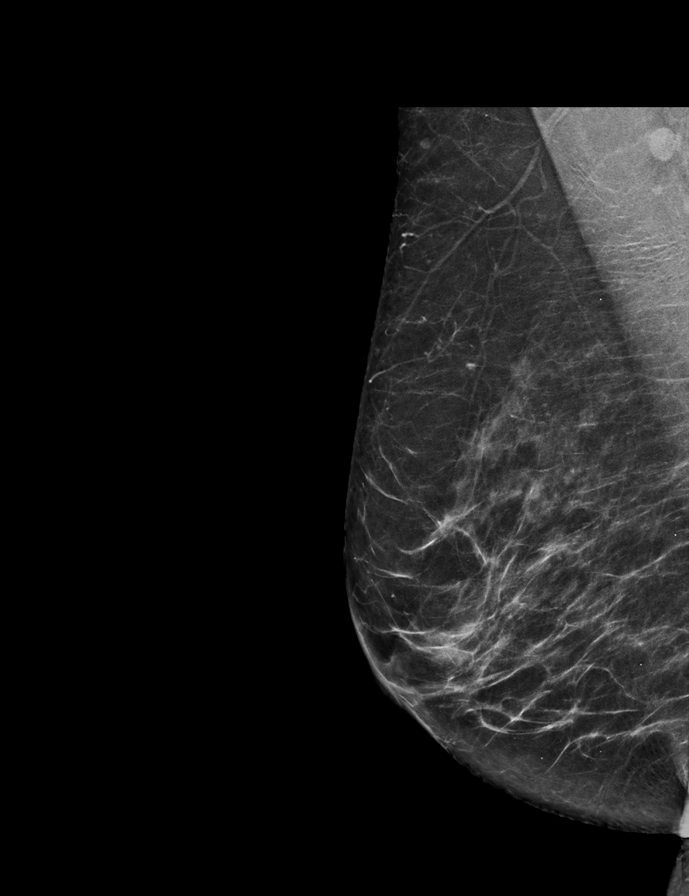

[L CC tomo · 2 of 55 frames shown]
[frame 18/55]
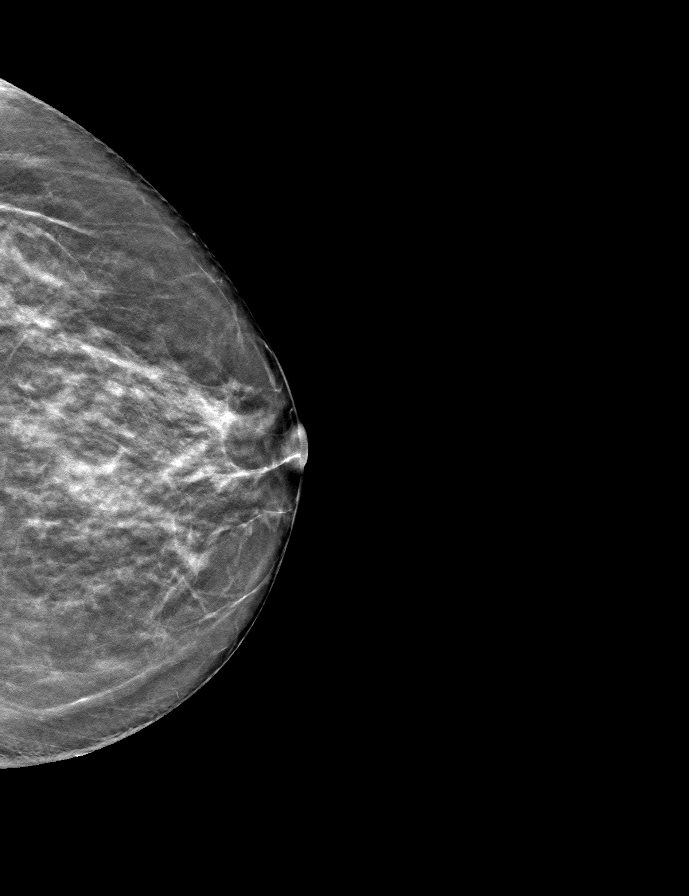
[frame 28/55]
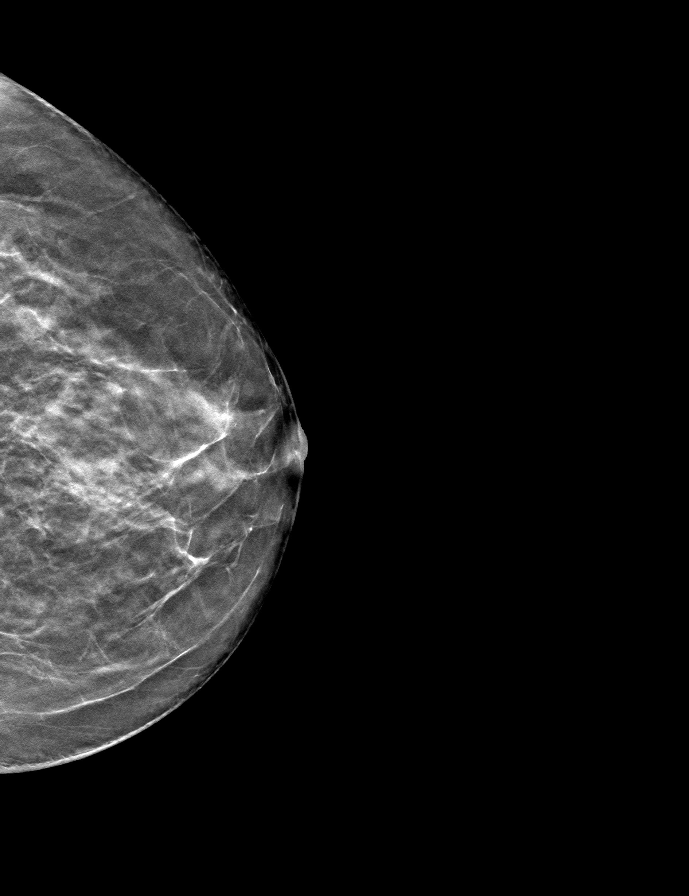

[L MLO tomo · tomo slice 32/63.0]
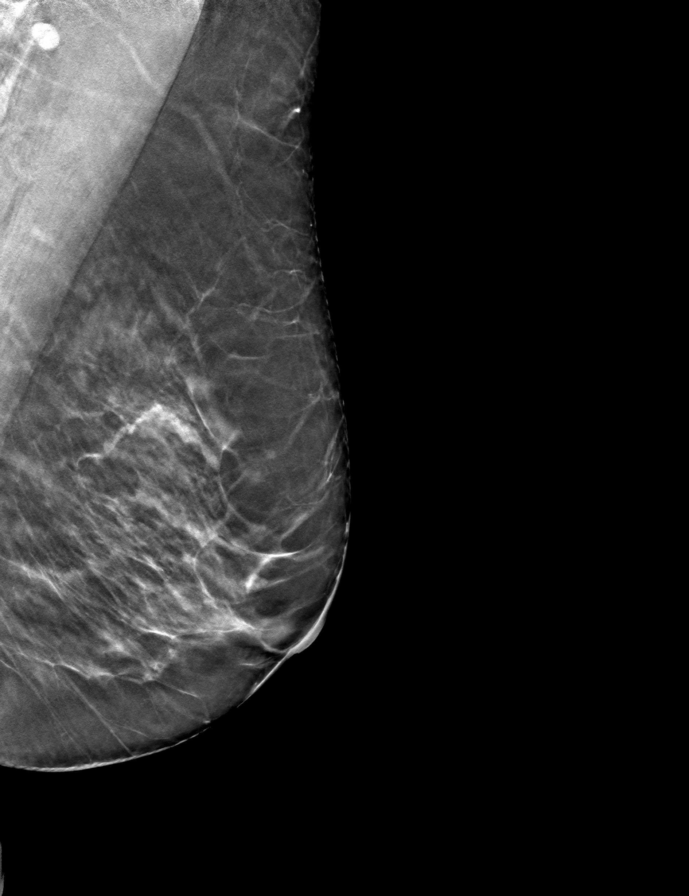

[R MLO tomo · tomo slice 31/62.0]
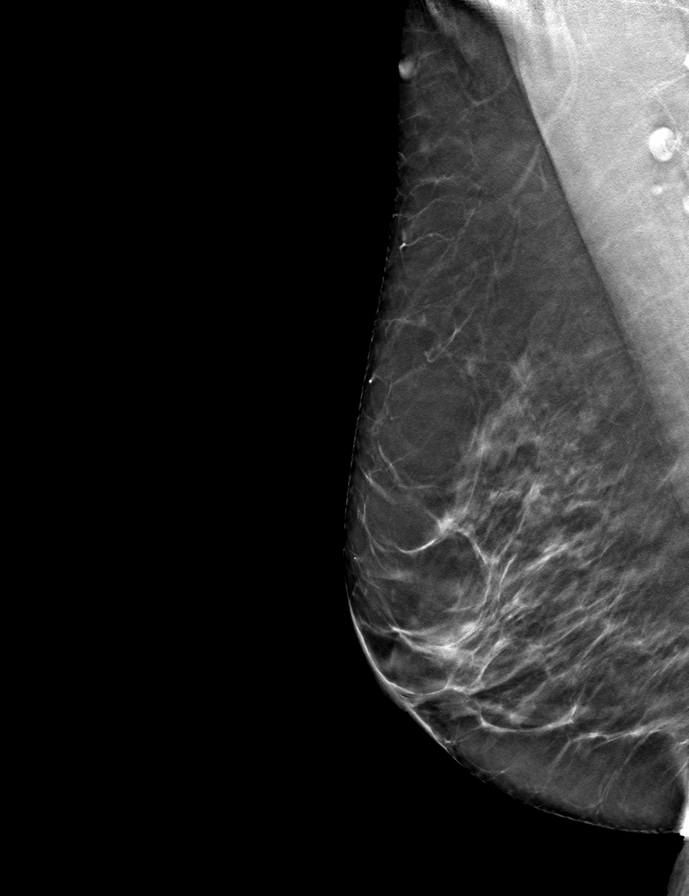

[R CC tomo · tomo slice 29/57.0]
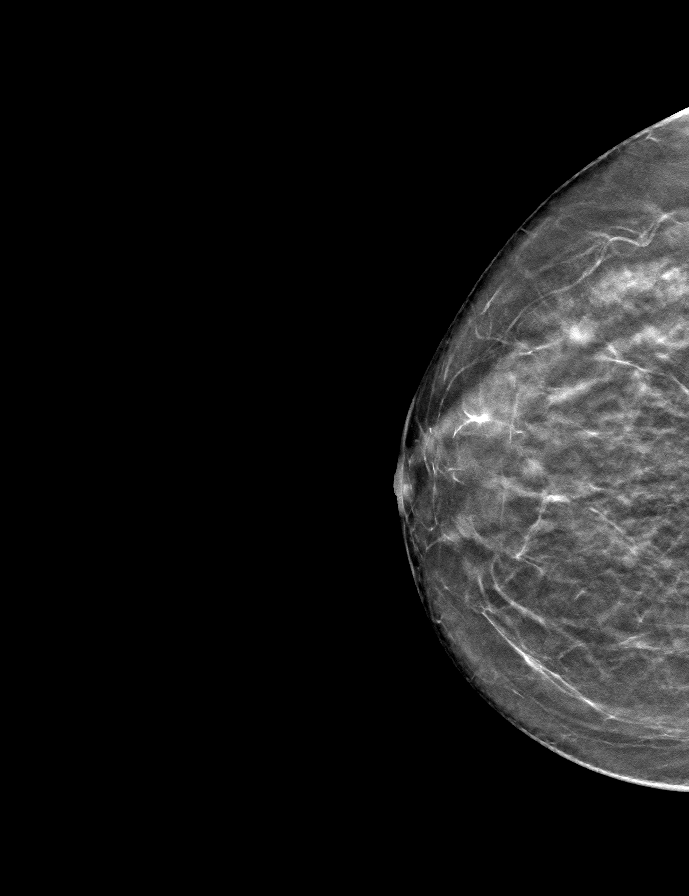

[9 of 24 positions shown; findings below may reference images not displayed]

ACR Breast Density Category c: The breast tissue is heterogeneously
dense, which may obscure small masses.
FINDINGS: There are no findings suspicious for malignancy. Images were
processed with CAD.
IMPRESSION: No mammographic evidence of malignancy. A result letter of this
screening mammogram will be mailed directly to the patient.

RECOMMENDATION:
Screening mammogram in one year. (Code:[id number])

BI-RADS CATEGORY  1: Negative.

## 2020-01-12 DIAGNOSIS — R300 Dysuria: Secondary | ICD-10-CM | POA: Diagnosis not present

## 2020-02-08 ENCOUNTER — Other Ambulatory Visit: Payer: Self-pay | Admitting: Internal Medicine

## 2020-02-08 DIAGNOSIS — Z1231 Encounter for screening mammogram for malignant neoplasm of breast: Secondary | ICD-10-CM

## 2020-03-12 ENCOUNTER — Other Ambulatory Visit: Payer: Self-pay

## 2020-03-12 ENCOUNTER — Ambulatory Visit
Admission: RE | Admit: 2020-03-12 | Discharge: 2020-03-12 | Disposition: A | Discharge status: Home / Self Care | Payer: Medicare Other | Source: Ambulatory Visit | Attending: Internal Medicine | Admitting: Internal Medicine

## 2020-03-12 DIAGNOSIS — Z1231 Encounter for screening mammogram for malignant neoplasm of breast: Secondary | ICD-10-CM

## 2020-03-14 ENCOUNTER — Other Ambulatory Visit: Payer: Self-pay | Admitting: Internal Medicine

## 2020-03-14 DIAGNOSIS — R928 Other abnormal and inconclusive findings on diagnostic imaging of breast: Secondary | ICD-10-CM

## 2020-03-27 ENCOUNTER — Ambulatory Visit
Admission: RE | Admit: 2020-03-27 | Discharge: 2020-03-27 | Disposition: A | Discharge status: Home / Self Care | Payer: Medicare Other | Source: Ambulatory Visit | Attending: Internal Medicine | Admitting: Internal Medicine

## 2020-03-27 ENCOUNTER — Other Ambulatory Visit: Payer: Self-pay

## 2020-03-27 ENCOUNTER — Ambulatory Visit: Payer: Medicare Other

## 2020-03-27 DIAGNOSIS — R928 Other abnormal and inconclusive findings on diagnostic imaging of breast: Secondary | ICD-10-CM

## 2020-03-27 DIAGNOSIS — R922 Inconclusive mammogram: Secondary | ICD-10-CM | POA: Diagnosis not present

## 2020-05-25 ENCOUNTER — Ambulatory Visit: Payer: Medicare Other | Attending: Internal Medicine

## 2020-05-25 DIAGNOSIS — Z23 Encounter for immunization: Secondary | ICD-10-CM

## 2020-05-25 NOTE — Progress Notes (Signed)
   Covid-19 Vaccination Clinic  Name:  IREOLUWA GORSLINE    MRN: [id number] DOB: 1951/03/21  05/25/2020  Ms. Huizar was observed post Covid-19 immunization for 15 minutes without incident. She was provided with Vaccine Information Sheet and instruction to access the V-Safe system.   Ms. Jolley was instructed to call 911 with any severe reactions post vaccine: Marland Kitchen Difficulty breathing  . Swelling of face and throat  . A fast heartbeat  . A bad rash all over body  . Dizziness and weakness

## 2020-06-06 DIAGNOSIS — E785 Hyperlipidemia, unspecified: Secondary | ICD-10-CM | POA: Diagnosis not present

## 2020-06-13 DIAGNOSIS — I700 Atherosclerosis of aorta: Secondary | ICD-10-CM | POA: Diagnosis not present

## 2020-06-13 DIAGNOSIS — E785 Hyperlipidemia, unspecified: Secondary | ICD-10-CM | POA: Diagnosis not present

## 2020-06-13 DIAGNOSIS — J449 Chronic obstructive pulmonary disease, unspecified: Secondary | ICD-10-CM | POA: Diagnosis not present

## 2020-06-13 DIAGNOSIS — Z0000 Encounter for general adult medical examination without abnormal findings: Secondary | ICD-10-CM | POA: Diagnosis not present

## 2020-06-13 DIAGNOSIS — R8290 Unspecified abnormal findings in urine: Secondary | ICD-10-CM | POA: Diagnosis not present

## 2020-06-13 DIAGNOSIS — R82998 Other abnormal findings in urine: Secondary | ICD-10-CM | POA: Diagnosis not present

## 2020-06-13 DIAGNOSIS — Z23 Encounter for immunization: Secondary | ICD-10-CM | POA: Diagnosis not present

## 2020-06-13 DIAGNOSIS — R030 Elevated blood-pressure reading, without diagnosis of hypertension: Secondary | ICD-10-CM | POA: Diagnosis not present

## 2020-06-13 DIAGNOSIS — M810 Age-related osteoporosis without current pathological fracture: Secondary | ICD-10-CM | POA: Diagnosis not present

## 2020-06-13 DIAGNOSIS — F418 Other specified anxiety disorders: Secondary | ICD-10-CM | POA: Diagnosis not present

## 2020-07-01 DIAGNOSIS — H40033 Anatomical narrow angle, bilateral: Secondary | ICD-10-CM | POA: Diagnosis not present

## 2020-07-01 DIAGNOSIS — H2513 Age-related nuclear cataract, bilateral: Secondary | ICD-10-CM | POA: Diagnosis not present

## 2020-07-11 DIAGNOSIS — H25813 Combined forms of age-related cataract, bilateral: Secondary | ICD-10-CM | POA: Diagnosis not present

## 2020-07-11 DIAGNOSIS — H43821 Vitreomacular adhesion, right eye: Secondary | ICD-10-CM | POA: Diagnosis not present

## 2020-07-11 DIAGNOSIS — H43812 Vitreous degeneration, left eye: Secondary | ICD-10-CM | POA: Diagnosis not present

## 2020-08-30 DIAGNOSIS — H25812 Combined forms of age-related cataract, left eye: Secondary | ICD-10-CM | POA: Diagnosis not present

## 2020-10-30 DIAGNOSIS — H2511 Age-related nuclear cataract, right eye: Secondary | ICD-10-CM | POA: Diagnosis not present

## 2020-11-01 DIAGNOSIS — H25811 Combined forms of age-related cataract, right eye: Secondary | ICD-10-CM | POA: Diagnosis not present

## 2020-11-06 ENCOUNTER — Other Ambulatory Visit: Payer: Self-pay

## 2020-11-06 ENCOUNTER — Encounter (INDEPENDENT_AMBULATORY_CARE_PROVIDER_SITE_OTHER): Payer: Medicare Other | Admitting: Ophthalmology

## 2020-11-06 DIAGNOSIS — H353121 Nonexudative age-related macular degeneration, left eye, early dry stage: Secondary | ICD-10-CM | POA: Diagnosis not present

## 2020-11-06 DIAGNOSIS — H43813 Vitreous degeneration, bilateral: Secondary | ICD-10-CM | POA: Diagnosis not present

## 2020-11-06 DIAGNOSIS — H35341 Macular cyst, hole, or pseudohole, right eye: Secondary | ICD-10-CM

## 2020-11-06 DIAGNOSIS — H2701 Aphakia, right eye: Secondary | ICD-10-CM | POA: Diagnosis not present

## 2020-11-15 NOTE — H&P (Signed)
Tiffany Simpson is an 70 y.o. female.   Chief Complaint:poor vision after cataract surgery right eye HPI: Had cataract surgery right eye with zonular failure.  No intraocular lens was placed.  Now cannot see.  Past Medical History:  Diagnosis Date  . Allergy   . Hyperlipidemia     Past Surgical History:  Procedure Laterality Date  . TOTAL ABDOMINAL HYSTERECTOMY      Family History  Problem Relation Age of Onset  . Breast cancer Maternal Aunt        half sister  . Colon cancer Neg Hx    Social History:  reports that she has quit smoking. Her smoking use included cigarettes. She has never used smokeless tobacco. She reports that she does not drink alcohol and does not use drugs.  Allergies:  Allergies  Allergen Reactions  . Sulfa Antibiotics Rash    No medications prior to admission.    Review of systems otherwise negative  There were no vitals taken for this visit.  Physical exam: Mental status: oriented x3. Eyes: See eye exam associated with this date of surgery in media tab.  Scanned in by scanning center Ears, Nose, Throat: within normal limits Neck: Within Normal limits General: within normal limits Chest: Within normal limits Breast: deferred Heart: Within normal limits Abdomen: Within normal limits GU: deferred Extremities: within normal limits Skin: within normal limits  Assessment/Plan Surgical aphakia, prolapsed vitreous into the anterior chamber right eye Plan: To [facility] for Pars plana vitrectomy, anterior and posterior right eye.  Placement of secondary intraocular lens with suture right eye  Sherrie George 11/15/2020, 3:29 PM

## 2020-11-22 ENCOUNTER — Other Ambulatory Visit (HOSPITAL_COMMUNITY)
Admission: RE | Admit: 2020-11-22 | Discharge: 2020-11-22 | Disposition: A | Discharge status: Home / Self Care | Payer: Medicare Other | Source: Ambulatory Visit | Attending: Ophthalmology | Admitting: Ophthalmology

## 2020-11-22 DIAGNOSIS — Z01812 Encounter for preprocedural laboratory examination: Secondary | ICD-10-CM | POA: Insufficient documentation

## 2020-11-22 DIAGNOSIS — Z20822 Contact with and (suspected) exposure to covid-19: Secondary | ICD-10-CM | POA: Diagnosis not present

## 2020-11-23 LAB — SARS CORONAVIRUS 2 (TAT 6-24 HRS): SARS Coronavirus 2: NEGATIVE

## 2020-11-25 ENCOUNTER — Encounter (HOSPITAL_COMMUNITY): Payer: Self-pay | Admitting: Ophthalmology

## 2020-11-25 ENCOUNTER — Other Ambulatory Visit: Payer: Self-pay

## 2020-11-25 NOTE — Progress Notes (Signed)
Patient denies shortness of breath, fever, cough or chest pain.  PCP - Dr Creola Corn Cardiologist - n/a  Chest x-ray - n/a EKG - 11/15/20 Stress Test - n/a ECHO - n/a Cardiac Cath - n/a  STOP now taking any Aspirin (unless otherwise instructed by your surgeon), Aleve, Naproxen, Ibuprofen, Motrin, Advil, Goody's, BC's, all herbal medications, fish oil, and all vitamins.   Coronavirus Screening Covid test on 11/22/20 was negative.  Patient verbalized understanding of instructions that were given via phone.

## 2020-11-26 ENCOUNTER — Encounter (HOSPITAL_COMMUNITY)
Admission: RE | Disposition: A | Discharge status: Home / Self Care | Payer: Self-pay | Source: Home / Self Care | Attending: Ophthalmology

## 2020-11-26 ENCOUNTER — Other Ambulatory Visit: Payer: Self-pay

## 2020-11-26 ENCOUNTER — Ambulatory Visit (HOSPITAL_COMMUNITY)
Admission: RE | Admit: 2020-11-26 | Discharge: 2020-11-27 | Disposition: A | Discharge status: Home / Self Care | Payer: Medicare Other | Attending: Ophthalmology | Admitting: Ophthalmology

## 2020-11-26 ENCOUNTER — Encounter (HOSPITAL_COMMUNITY): Payer: Self-pay | Admitting: Ophthalmology

## 2020-11-26 ENCOUNTER — Ambulatory Visit (HOSPITAL_COMMUNITY): Payer: Medicare Other | Admitting: Anesthesiology

## 2020-11-26 DIAGNOSIS — Z87891 Personal history of nicotine dependence: Secondary | ICD-10-CM | POA: Insufficient documentation

## 2020-11-26 DIAGNOSIS — Z882 Allergy status to sulfonamides status: Secondary | ICD-10-CM | POA: Diagnosis not present

## 2020-11-26 DIAGNOSIS — H43311 Vitreous membranes and strands, right eye: Secondary | ICD-10-CM | POA: Insufficient documentation

## 2020-11-26 DIAGNOSIS — H2701 Aphakia, right eye: Secondary | ICD-10-CM | POA: Diagnosis not present

## 2020-11-26 DIAGNOSIS — Z90710 Acquired absence of both cervix and uterus: Secondary | ICD-10-CM | POA: Diagnosis not present

## 2020-11-26 DIAGNOSIS — E785 Hyperlipidemia, unspecified: Secondary | ICD-10-CM | POA: Insufficient documentation

## 2020-11-26 HISTORY — PX: AIR/FLUID EXCHANGE: SHX6494

## 2020-11-26 HISTORY — PX: PARS PLANA VITRECTOMY: SHX2166

## 2020-11-26 HISTORY — PX: LASER PHOTO ABLATION: SHX5942

## 2020-11-26 HISTORY — PX: PLACEMENT AND SUTURE OF SECONDARY INTRAOCULAR LENS: SHX5338

## 2020-11-26 SURGERY — PARS PLANA VITRECTOMY WITH 25G REMOVAL/SUTURE INTRAOCULAR LENS
Anesthesia: General | Site: Eye | Laterality: Right

## 2020-11-26 MED ORDER — DEXAMETHASONE SODIUM PHOSPHATE 10 MG/ML IJ SOLN
INTRAMUSCULAR | Status: DC | PRN
  Administered 2020-11-26: 4 mg via INTRAVENOUS

## 2020-11-26 MED ORDER — PHENYLEPHRINE HCL 2.5 % OP SOLN
1.0000 [drp] | OPHTHALMIC | Status: AC | PRN
  Administered 2020-11-26 (×3): 1 [drp] via OPHTHALMIC
  Filled 2020-11-26: qty 2

## 2020-11-26 MED ORDER — ORAL CARE MOUTH RINSE: 15.0000 mL | Freq: Once | OROMUCOSAL | Status: DC

## 2020-11-26 MED ORDER — ONDANSETRON HCL 4 MG/2ML IJ SOLN
INTRAMUSCULAR | Status: AC
  Filled 2020-11-26: qty 2

## 2020-11-26 MED ORDER — MIDAZOLAM HCL 2 MG/2ML IJ SOLN
INTRAMUSCULAR | Status: AC
  Filled 2020-11-26: qty 2

## 2020-11-26 MED ORDER — MORPHINE SULFATE (PF) 2 MG/ML IV SOLN
1.0000 mg | INTRAVENOUS | Status: DC | PRN
Start: 2020-11-26 — End: 2020-11-27

## 2020-11-26 MED ORDER — PHENYLEPHRINE HCL-NACL 10-0.9 MG/250ML-% IV SOLN
INTRAVENOUS | Status: DC | PRN
  Administered 2020-11-26: 25 ug/min via INTRAVENOUS

## 2020-11-26 MED ORDER — HYALURONIDASE HUMAN 150 UNIT/ML IJ SOLN
INTRAMUSCULAR | Status: AC
  Filled 2020-11-26: qty 1

## 2020-11-26 MED ORDER — SODIUM HYALURONATE 10 MG/ML IO SOLUTION
PREFILLED_SYRINGE | INTRAOCULAR | Status: AC
  Filled 2020-11-26: qty 0.85

## 2020-11-26 MED ORDER — EPHEDRINE SULFATE-NACL 50-0.9 MG/10ML-% IV SOSY
PREFILLED_SYRINGE | INTRAVENOUS | Status: DC | PRN
  Administered 2020-11-26: 5 mg via INTRAVENOUS

## 2020-11-26 MED ORDER — HYPROMELLOSE (GONIOSCOPIC) 2.5 % OP SOLN
OPHTHALMIC | Status: AC
  Filled 2020-11-26: qty 15

## 2020-11-26 MED ORDER — CEFAZOLIN SODIUM-DEXTROSE 2-4 GM/100ML-% IV SOLN
2.0000 g | INTRAVENOUS | Status: AC
  Administered 2020-11-26: 2 g via INTRAVENOUS
  Filled 2020-11-26: qty 100

## 2020-11-26 MED ORDER — ATORVASTATIN CALCIUM 10 MG PO TABS
20.0000 mg | ORAL_TABLET | Freq: Every day | ORAL | Status: DC
  Administered 2020-11-26: 20 mg via ORAL
  Filled 2020-11-26: qty 2

## 2020-11-26 MED ORDER — BACITRACIN-POLYMYXIN B 500-10000 UNIT/GM OP OINT
TOPICAL_OINTMENT | OPHTHALMIC | Status: AC
  Filled 2020-11-26: qty 3.5

## 2020-11-26 MED ORDER — CHLORHEXIDINE GLUCONATE 0.12 % MT SOLN: 15.0000 mL | Freq: Once | OROMUCOSAL | Status: DC

## 2020-11-26 MED ORDER — PHENYLEPHRINE HCL 2.5 % OP SOLN: 1.0000 [drp] | OPHTHALMIC | Status: DC | PRN

## 2020-11-26 MED ORDER — LIDOCAINE HCL 2 % IJ SOLN
INTRAMUSCULAR | Status: AC
  Filled 2020-11-26: qty 20

## 2020-11-26 MED ORDER — EPINEPHRINE PF 1 MG/ML IJ SOLN
INTRAMUSCULAR | Status: AC
  Filled 2020-11-26: qty 1

## 2020-11-26 MED ORDER — SODIUM HYALURONATE 10 MG/ML IO SOLUTION
PREFILLED_SYRINGE | INTRAOCULAR | Status: DC | PRN
  Administered 2020-11-26: 0.85 mL via INTRAOCULAR

## 2020-11-26 MED ORDER — TEMAZEPAM 15 MG PO CAPS: 15.0000 mg | ORAL_CAPSULE | Freq: Every evening | ORAL | Status: DC | PRN

## 2020-11-26 MED ORDER — ROCURONIUM BROMIDE 10 MG/ML (PF) SYRINGE
PREFILLED_SYRINGE | INTRAVENOUS | Status: DC | PRN
  Administered 2020-11-26: 60 mg via INTRAVENOUS
  Administered 2020-11-26: 40 mg via INTRAVENOUS

## 2020-11-26 MED ORDER — GATIFLOXACIN 0.5 % OP SOLN: 1.0000 [drp] | OPHTHALMIC | Status: DC | PRN

## 2020-11-26 MED ORDER — PREDNISOLONE ACETATE 1 % OP SUSP
1.0000 [drp] | Freq: Four times a day (QID) | OPHTHALMIC | Status: DC
  Filled 2020-11-26: qty 5

## 2020-11-26 MED ORDER — DEXAMETHASONE SODIUM PHOSPHATE 10 MG/ML IJ SOLN
INTRAMUSCULAR | Status: AC
  Filled 2020-11-26: qty 1

## 2020-11-26 MED ORDER — HYDROCODONE-ACETAMINOPHEN 5-325 MG PO TABS: 1.0000 | ORAL_TABLET | ORAL | Status: DC | PRN

## 2020-11-26 MED ORDER — BACITRACIN-POLYMYXIN B 500-10000 UNIT/GM OP OINT
TOPICAL_OINTMENT | OPHTHALMIC | Status: DC | PRN
  Administered 2020-11-26: 1 via OPHTHALMIC

## 2020-11-26 MED ORDER — DEXAMETHASONE SODIUM PHOSPHATE 10 MG/ML IJ SOLN
INTRAMUSCULAR | Status: AC
  Filled 2020-11-26: qty 4

## 2020-11-26 MED ORDER — TROPICAMIDE 1 % OP SOLN: 1.0000 [drp] | OPHTHALMIC | Status: DC | PRN

## 2020-11-26 MED ORDER — SODIUM CHLORIDE (PF) 0.9 % IJ SOLN
INTRAMUSCULAR | Status: AC
  Filled 2020-11-26: qty 10

## 2020-11-26 MED ORDER — PROPOFOL 10 MG/ML IV BOLUS
INTRAVENOUS | Status: AC
  Filled 2020-11-26: qty 40

## 2020-11-26 MED ORDER — FENTANYL CITRATE (PF) 250 MCG/5ML IJ SOLN
INTRAMUSCULAR | Status: DC | PRN
  Administered 2020-11-26 (×3): 50 ug via INTRAVENOUS

## 2020-11-26 MED ORDER — DORZOLAMIDE HCL 2 % OP SOLN
1.0000 [drp] | Freq: Three times a day (TID) | OPHTHALMIC | Status: DC
  Filled 2020-11-26: qty 10

## 2020-11-26 MED ORDER — BUPIVACAINE HCL (PF) 0.75 % IJ SOLN
INTRAMUSCULAR | Status: DC | PRN
  Administered 2020-11-26: 10 mL

## 2020-11-26 MED ORDER — FENTANYL CITRATE (PF) 100 MCG/2ML IJ SOLN
25.0000 ug | INTRAMUSCULAR | Status: DC | PRN
  Administered 2020-11-26: 25 ug via INTRAVENOUS

## 2020-11-26 MED ORDER — ACETAMINOPHEN 325 MG PO TABS
325.0000 mg | ORAL_TABLET | ORAL | Status: DC | PRN
Start: 2020-11-26 — End: 2020-11-27

## 2020-11-26 MED ORDER — BSS PLUS IO SOLN
INTRAOCULAR | Status: AC
  Filled 2020-11-26: qty 500

## 2020-11-26 MED ORDER — ONDANSETRON HCL 4 MG/2ML IJ SOLN
INTRAMUSCULAR | Status: DC | PRN
  Administered 2020-11-26: 4 mg via INTRAVENOUS

## 2020-11-26 MED ORDER — ROCURONIUM BROMIDE 10 MG/ML (PF) SYRINGE
PREFILLED_SYRINGE | INTRAVENOUS | Status: AC
  Filled 2020-11-26: qty 10

## 2020-11-26 MED ORDER — TETRACAINE HCL 0.5 % OP SOLN
2.0000 [drp] | Freq: Once | OPHTHALMIC | Status: DC
  Filled 2020-11-26: qty 4

## 2020-11-26 MED ORDER — MIDAZOLAM HCL 2 MG/2ML IJ SOLN
INTRAMUSCULAR | Status: DC | PRN
  Administered 2020-11-26: 2 mg via INTRAVENOUS

## 2020-11-26 MED ORDER — STERILE WATER FOR INJECTION IJ SOLN
INTRAMUSCULAR | Status: DC | PRN
  Administered 2020-11-26: 100 mL

## 2020-11-26 MED ORDER — CYCLOPENTOLATE HCL 1 % OP SOLN
1.0000 [drp] | OPHTHALMIC | Status: AC | PRN
  Administered 2020-11-26 (×3): 1 [drp] via OPHTHALMIC
  Filled 2020-11-26: qty 2

## 2020-11-26 MED ORDER — ONDANSETRON HCL 4 MG/2ML IJ SOLN: 4.0000 mg | Freq: Four times a day (QID) | INTRAMUSCULAR | Status: DC | PRN

## 2020-11-26 MED ORDER — BUPIVACAINE HCL (PF) 0.75 % IJ SOLN
INTRAMUSCULAR | Status: AC
  Filled 2020-11-26: qty 10

## 2020-11-26 MED ORDER — BSS IO SOLN
INTRAOCULAR | Status: DC | PRN
  Administered 2020-11-26: 15 mL via INTRAOCULAR

## 2020-11-26 MED ORDER — STERILE WATER FOR INJECTION IJ SOLN
INTRAMUSCULAR | Status: DC | PRN
  Administered 2020-11-26: 20 mL

## 2020-11-26 MED ORDER — DORZOLAMIDE HCL-TIMOLOL MAL 2-0.5 % OP SOLN
1.0000 [drp] | OPHTHALMIC | Status: AC
  Administered 2020-11-26: 1 [drp] via OPHTHALMIC
  Filled 2020-11-26: qty 10

## 2020-11-26 MED ORDER — EPINEPHRINE PF 1 MG/ML IJ SOLN
INTRAOCULAR | Status: DC | PRN
  Administered 2020-11-26: 500 mL

## 2020-11-26 MED ORDER — CHLORHEXIDINE GLUCONATE 0.12 % MT SOLN
OROMUCOSAL | Status: AC
  Administered 2020-11-26: 15 mL
  Filled 2020-11-26: qty 15

## 2020-11-26 MED ORDER — BRIMONIDINE TARTRATE 0.2 % OP SOLN
1.0000 [drp] | Freq: Two times a day (BID) | OPHTHALMIC | Status: DC
  Filled 2020-11-26: qty 5

## 2020-11-26 MED ORDER — BUPIVACAINE-EPINEPHRINE (PF) 0.25% -1:200000 IJ SOLN
INTRAMUSCULAR | Status: AC
  Filled 2020-11-26: qty 30

## 2020-11-26 MED ORDER — BSS IO SOLN
INTRAOCULAR | Status: AC
  Filled 2020-11-26: qty 30

## 2020-11-26 MED ORDER — TROPICAMIDE 1 % OP SOLN
1.0000 [drp] | OPHTHALMIC | Status: AC | PRN
  Administered 2020-11-26 (×3): 1 [drp] via OPHTHALMIC
  Filled 2020-11-26: qty 15

## 2020-11-26 MED ORDER — BSS IO SOLN
INTRAOCULAR | Status: AC
  Filled 2020-11-26: qty 15

## 2020-11-26 MED ORDER — POLYMYXIN B SULFATE 500000 UNITS IJ SOLR
INTRAMUSCULAR | Status: AC
  Filled 2020-11-26: qty 500000

## 2020-11-26 MED ORDER — PROPOFOL 10 MG/ML IV BOLUS
INTRAVENOUS | Status: DC | PRN
  Administered 2020-11-26: 120 mg via INTRAVENOUS

## 2020-11-26 MED ORDER — ONDANSETRON HCL 4 MG/2ML IJ SOLN
INTRAMUSCULAR | Status: AC
  Filled 2020-11-26: qty 8

## 2020-11-26 MED ORDER — DEXAMETHASONE SODIUM PHOSPHATE 10 MG/ML IJ SOLN
INTRAMUSCULAR | Status: DC | PRN
  Administered 2020-11-26: 10 mg

## 2020-11-26 MED ORDER — SODIUM CHLORIDE 0.45 % IV SOLN
INTRAVENOUS | Status: DC
  Administered 2020-11-26: 990 mL via INTRAVENOUS

## 2020-11-26 MED ORDER — FENTANYL CITRATE (PF) 100 MCG/2ML IJ SOLN
INTRAMUSCULAR | Status: AC
  Filled 2020-11-26: qty 2

## 2020-11-26 MED ORDER — GATIFLOXACIN 0.5 % OP SOLN
1.0000 [drp] | OPHTHALMIC | Status: AC | PRN
  Administered 2020-11-26 (×3): 1 [drp] via OPHTHALMIC
  Filled 2020-11-26: qty 2.5

## 2020-11-26 MED ORDER — OXYCODONE HCL 5 MG PO TABS: 5.0000 mg | ORAL_TABLET | Freq: Once | ORAL | Status: DC | PRN

## 2020-11-26 MED ORDER — BACITRACIN-POLYMYXIN B 500-10000 UNIT/GM OP OINT
1.0000 "application " | TOPICAL_OINTMENT | Freq: Three times a day (TID) | OPHTHALMIC | Status: DC
  Filled 2020-11-26: qty 3.5

## 2020-11-26 MED ORDER — CEFTAZIDIME 1 G IJ SOLR
INTRAMUSCULAR | Status: AC
  Filled 2020-11-26: qty 1

## 2020-11-26 MED ORDER — STERILE WATER FOR INJECTION IJ SOLN
INTRAMUSCULAR | Status: AC
  Filled 2020-11-26: qty 20

## 2020-11-26 MED ORDER — ATROPINE SULFATE 1 % OP SOLN
OPHTHALMIC | Status: AC
  Filled 2020-11-26: qty 5

## 2020-11-26 MED ORDER — LIDOCAINE 2% (20 MG/ML) 5 ML SYRINGE
INTRAMUSCULAR | Status: DC | PRN
  Administered 2020-11-26: 60 mg via INTRAVENOUS

## 2020-11-26 MED ORDER — SODIUM CHLORIDE 0.9 % IV SOLN: INTRAVENOUS | Status: DC

## 2020-11-26 MED ORDER — SODIUM CHLORIDE (PF) 0.9 % IJ SOLN: INTRAMUSCULAR | Status: DC | PRN

## 2020-11-26 MED ORDER — ACETAZOLAMIDE SODIUM 500 MG IJ SOLR
INTRAMUSCULAR | Status: AC
  Filled 2020-11-26: qty 500

## 2020-11-26 MED ORDER — ACETAZOLAMIDE SODIUM 500 MG IJ SOLR
500.0000 mg | Freq: Once | INTRAMUSCULAR | Status: AC
  Administered 2020-11-27: 500 mg via INTRAVENOUS
  Filled 2020-11-26: qty 500

## 2020-11-26 MED ORDER — 0.9 % SODIUM CHLORIDE (POUR BTL) OPTIME
TOPICAL | Status: DC | PRN
  Administered 2020-11-26: 1000 mL

## 2020-11-26 MED ORDER — SUGAMMADEX SODIUM 200 MG/2ML IV SOLN
INTRAVENOUS | Status: DC | PRN
  Administered 2020-11-26: 100 mg via INTRAVENOUS
  Administered 2020-11-26: 200 mg via INTRAVENOUS

## 2020-11-26 MED ORDER — MAGNESIUM HYDROXIDE 400 MG/5ML PO SUSP
15.0000 mL | Freq: Four times a day (QID) | ORAL | Status: DC | PRN
Start: 2020-11-26 — End: 2020-11-27

## 2020-11-26 MED ORDER — CYCLOPENTOLATE HCL 1 % OP SOLN: 1.0000 [drp] | OPHTHALMIC | Status: DC | PRN

## 2020-11-26 MED ORDER — PROMETHAZINE HCL 25 MG/ML IJ SOLN: 6.2500 mg | INTRAMUSCULAR | Status: DC | PRN

## 2020-11-26 MED ORDER — OXYCODONE HCL 5 MG/5ML PO SOLN: 5.0000 mg | Freq: Once | ORAL | Status: DC | PRN

## 2020-11-26 MED ORDER — LIDOCAINE 2% (20 MG/ML) 5 ML SYRINGE
INTRAMUSCULAR | Status: AC
  Filled 2020-11-26: qty 5

## 2020-11-26 MED ORDER — LATANOPROST 0.005 % OP SOLN
1.0000 [drp] | Freq: Every day | OPHTHALMIC | Status: DC
  Filled 2020-11-26: qty 2.5

## 2020-11-26 MED ORDER — TRIAMCINOLONE ACETONIDE 40 MG/ML IJ SUSP
INTRAMUSCULAR | Status: AC
  Filled 2020-11-26: qty 5

## 2020-11-26 MED ORDER — GATIFLOXACIN 0.5 % OP SOLN
1.0000 [drp] | Freq: Four times a day (QID) | OPHTHALMIC | Status: DC
  Filled 2020-11-26: qty 2.5

## 2020-11-26 MED ORDER — FENTANYL CITRATE (PF) 250 MCG/5ML IJ SOLN
INTRAMUSCULAR | Status: AC
  Filled 2020-11-26: qty 5

## 2020-11-26 SURGICAL SUPPLY — 70 items
APPLICATOR DR MATTHEWS STRL (MISCELLANEOUS) ×1 IMPLANT
BAND WRIST GAS GREEN (MISCELLANEOUS) IMPLANT
BLADE EYE CATARACT 19 1.4 BEAV (BLADE) IMPLANT
BLADE KERATOME 2.75 (BLADE) ×2 IMPLANT
CABLE BIPOLOR RESECTION CORD (MISCELLANEOUS) ×2 IMPLANT
CANNULA VLV SOFT TIP 25G (OPHTHALMIC) ×1 IMPLANT
CANNULA VLV SOFT TIP 25GA (OPHTHALMIC) ×2 IMPLANT
COTTONBALL LRG STERILE PKG (GAUZE/BANDAGES/DRESSINGS) ×6 IMPLANT
COVER MAYO STAND STRL (DRAPES) ×2 IMPLANT
COVER WAND RF STERILE (DRAPES) ×2 IMPLANT
DRAPE INCISE 51X51 W/FILM STRL (DRAPES) ×1 IMPLANT
DRAPE OPHTHALMIC 77X100 STRL (CUSTOM PROCEDURE TRAY) ×2 IMPLANT
FILTER BLUE MILLIPORE (MISCELLANEOUS) IMPLANT
FORCEPS ECKARDT ILM 25G SERR (OPHTHALMIC RELATED) IMPLANT
FORCEPS GRIESHABER ILM 25G A (INSTRUMENTS) ×2 IMPLANT
FORCEPS HORIZONTAL 25G DISP (OPHTHALMIC RELATED) IMPLANT
GAS OPHTHALMIC (MISCELLANEOUS) IMPLANT
GAS WRIST BAND GREEN (MISCELLANEOUS)
GLOVE SS BIOGEL STRL SZ 6.5 (GLOVE) ×2 IMPLANT
GLOVE SS BIOGEL STRL SZ 7 (GLOVE) ×1 IMPLANT
GLOVE SUPERSENSE BIOGEL SZ 6.5 (GLOVE) ×2
GLOVE SUPERSENSE BIOGEL SZ 7 (GLOVE) ×1
GLOVE SURG 8.5 LATEX PF (GLOVE) ×2 IMPLANT
GLOVE TRIUMPH SURG SIZE 8.5 (KITS) ×2 IMPLANT
GOWN STRL REUS W/ TWL LRG LVL3 (GOWN DISPOSABLE) ×3 IMPLANT
GOWN STRL REUS W/TWL LRG LVL3 (GOWN DISPOSABLE) ×6
HANDLE PNEUMATIC FOR CONSTEL (OPHTHALMIC) ×2 IMPLANT
KIT BASIN OR (CUSTOM PROCEDURE TRAY) ×2 IMPLANT
KIT TURNOVER KIT B (KITS) ×2 IMPLANT
KNIFE CRESCENT 1.75 EDGEAHEAD (BLADE) IMPLANT
LENS IOL POST 1PIECE DIOP 16.0 (Intraocular Lens) ×1 IMPLANT
MICROPICK 25G (MISCELLANEOUS)
NDL 18GX1X1/2 (RX/OR ONLY) (NEEDLE) ×1 IMPLANT
NDL 25GX 5/8IN NON SAFETY (NEEDLE) ×1 IMPLANT
NDL 27GX1/2 REG BEVEL ECLIP (NEEDLE) IMPLANT
NDL FILTER BLUNT 18X1 1/2 (NEEDLE) ×1 IMPLANT
NDL HYPO 30X.5 LL (NEEDLE) ×1 IMPLANT
NEEDLE 18GX1X1/2 (RX/OR ONLY) (NEEDLE) ×2 IMPLANT
NEEDLE 25GX 5/8IN NON SAFETY (NEEDLE) ×2 IMPLANT
NEEDLE 27GX1/2 REG BEVEL ECLIP (NEEDLE) IMPLANT
NEEDLE FILTER BLUNT 18X 1/2SAF (NEEDLE) ×1
NEEDLE FILTER BLUNT 18X1 1/2 (NEEDLE) ×1 IMPLANT
NEEDLE HYPO 30X.5 LL (NEEDLE) ×2 IMPLANT
NS IRRIG 1000ML POUR BTL (IV SOLUTION) ×2 IMPLANT
PACK VITRECTOMY CUSTOM (CUSTOM PROCEDURE TRAY) ×2 IMPLANT
PAD ARMBOARD 7.5X6 YLW CONV (MISCELLANEOUS) ×4 IMPLANT
PAK PIK VITRECTOMY CVS 25GA (OPHTHALMIC) ×2 IMPLANT
PENCIL BIPOLAR 25GA STR DISP (OPHTHALMIC RELATED) IMPLANT
PIC ILLUMINATED 25G (OPHTHALMIC) ×2
PICK MICROPICK 25G (MISCELLANEOUS) IMPLANT
PIK ILLUMINATED 25G (OPHTHALMIC) ×1 IMPLANT
PROBE LASER ILLUM FLEX CVD 25G (OPHTHALMIC) IMPLANT
ROLLS DENTAL (MISCELLANEOUS) ×4 IMPLANT
SCRAPER DIAMOND 25GA (OPHTHALMIC RELATED) IMPLANT
SPONGE SURGIFOAM ABS GEL 12-7 (HEMOSTASIS) ×2 IMPLANT
STOPCOCK 4 WAY LG BORE MALE ST (IV SETS) IMPLANT
SUT CHROMIC 7 0 TG140 8 (SUTURE) ×2 IMPLANT
SUT ETHILON 10 0 CS140 6 (SUTURE) ×3 IMPLANT
SUT ETHILON 9 0 TG140 8 (SUTURE) ×1 IMPLANT
SUT POLY NON ABSORB 10-0 8 STR (SUTURE) ×4 IMPLANT
SUT SILK 4 0 RB 1 (SUTURE) ×1 IMPLANT
SYR 10ML LL (SYRINGE) ×2 IMPLANT
SYR 20ML LL LF (SYRINGE) ×2 IMPLANT
SYR 5ML LL (SYRINGE) IMPLANT
SYR BULB EAR ULCER 3OZ GRN STR (SYRINGE) ×2 IMPLANT
SYR TB 1ML LUER SLIP (SYRINGE) ×2 IMPLANT
TAPE SURG TRANSPORE 1 IN (GAUZE/BANDAGES/DRESSINGS) ×1 IMPLANT
TAPE SURGICAL TRANSPORE 1 IN (GAUZE/BANDAGES/DRESSINGS) ×2
TUBING HIGH PRESS EXTEN 6IN (TUBING) IMPLANT
WATER STERILE IRR 1000ML POUR (IV SOLUTION) ×2 IMPLANT

## 2020-11-26 NOTE — Anesthesia Postprocedure Evaluation (Signed)
Anesthesia Post Note  Patient: Tiffany Simpson  Procedure(s) Performed: PARS PLANA VITRECTOMY WITH 25G ANTERIOR AND POSTERIOR RIGHT EYE (Right Eye) PLACEMENT AND SUTURE OF SECONDARY INTRAOCULAR LENS RIGHT EYE (Right Eye) LASER PHOTO ABLATION (Right Eye) AIR/FLUID EXCHANGE (Right Eye)     Patient location during evaluation: PACU Anesthesia Type: General Level of consciousness: awake Pain management: pain level controlled Vital Signs Assessment: post-procedure vital signs reviewed and stable Respiratory status: spontaneous breathing and respiratory function stable Cardiovascular status: stable Postop Assessment: no apparent nausea or vomiting Anesthetic complications: no   No complications documented.  Last Vitals:  Vitals:   11/26/20 0921 11/26/20 1405  BP: (!) 171/63 (!) 125/46  Pulse: 81 76  Resp: 18 16  Temp: 36.7 C (!) 36.3 C  SpO2: 98% 100%    Last Pain:  Vitals:   11/26/20 1405  TempSrc:   PainSc: Asleep                 Mellody Dance

## 2020-11-26 NOTE — Anesthesia Preprocedure Evaluation (Addendum)
Anesthesia Evaluation  Patient identified by MRN, date of birth, ID band Patient awake    Reviewed: Allergy & Precautions, NPO status , Patient's Chart, lab work & pertinent test results  Airway Mallampati: II  TM Distance: <3 FB Neck ROM: Full    Dental  (+) Edentulous Upper, Edentulous Lower   Pulmonary former smoker,    Pulmonary exam normal breath sounds clear to auscultation       Cardiovascular negative cardio ROS Normal cardiovascular exam Rhythm:Regular Rate:Normal     Neuro/Psych negative neurological ROS  negative psych ROS   GI/Hepatic negative GI ROS, Neg liver ROS,   Endo/Other  Hyperlipidemia   Renal/GU negative Renal ROS  negative genitourinary   Musculoskeletal negative musculoskeletal ROS (+)   Abdominal Normal abdominal exam  (+)   Peds negative pediatric ROS (+)  Hematology negative hematology ROS (+)   Anesthesia Other Findings   Reproductive/Obstetrics negative OB ROS                            Anesthesia Physical Anesthesia Plan  ASA: II  Anesthesia Plan: General   Post-op Pain Management:    Induction: Intravenous  PONV Risk Score and Plan: 2  Airway Management Planned: LMA  Additional Equipment:   Intra-op Plan:   Post-operative Plan: Extubation in OR  Informed Consent: I have reviewed the patients History and Physical, chart, labs and discussed the procedure including the risks, benefits and alternatives for the proposed anesthesia with the patient or authorized representative who has indicated his/her understanding and acceptance.       Plan Discussed with: Anesthesiologist and CRNA  Anesthesia Plan Comments:        Anesthesia Quick Evaluation

## 2020-11-26 NOTE — Transfer of Care (Signed)
Immediate Anesthesia Transfer of Care Note  Patient: Tiffany Simpson  Procedure(s) Performed: PARS PLANA VITRECTOMY WITH 25G ANTERIOR AND POSTERIOR RIGHT EYE (Right Eye) PLACEMENT AND SUTURE OF SECONDARY INTRAOCULAR LENS RIGHT EYE (Right Eye) LASER PHOTO ABLATION (Right Eye) AIR/FLUID EXCHANGE (Right Eye)  Patient Location: PACU  Anesthesia Type:General  Level of Consciousness: awake, alert , drowsy and patient cooperative  Airway & Oxygen Therapy: Patient Spontanous Breathing and Patient connected to nasal cannula oxygen  Post-op Assessment: Report given to RN, Post -op Vital signs reviewed and stable and Patient moving all extremities  Post vital signs: Reviewed and stable  Last Vitals:  Vitals Value Taken Time  BP 125/46   Temp    Pulse 76 11/26/20 1404  Resp 16 11/26/20 1404  SpO2 100 % 11/26/20 1404  Vitals shown include unvalidated device data.  Last Pain:  Vitals:   11/26/20 0948  TempSrc:   PainSc: 0-No pain      Patients Stated Pain Goal: 7 (11/26/20 0948)  Complications: No complications documented.

## 2020-11-26 NOTE — H&P (Signed)
I examined the patient today and there is no change in the medical status 

## 2020-11-26 NOTE — Anesthesia Procedure Notes (Signed)
Procedure Name: Intubation Date/Time: 11/26/2020 11:55 AM Performed by: Aundria Rud, CRNA Pre-anesthesia Checklist: Patient identified, Emergency Drugs available, Suction available and Patient being monitored Patient Re-evaluated:Patient Re-evaluated prior to induction Oxygen Delivery Method: Circle System Utilized Preoxygenation: Pre-oxygenation with 100% oxygen Induction Type: IV induction Ventilation: Mask ventilation without difficulty Laryngoscope Size: Miller and 2 Grade View: Grade I Tube type: Oral Tube size: 7.0 mm Number of attempts: 1 Airway Equipment and Method: Stylet and Oral airway Placement Confirmation: ETT inserted through vocal cords under direct vision,  positive ETCO2 and breath sounds checked- equal and bilateral Secured at: 21 cm Tube secured with: Tape Dental Injury: Teeth and Oropharynx as per pre-operative assessment

## 2020-11-26 NOTE — Brief Op Note (Signed)
Brief Operative note   Preoperative diagnosis:  Aphakia right eye Postoperative diagnosis  * No Diagnosis Codes entered *  Procedures:  Pars plana vitrectomy, laser photocoagulation of retinal break, placement of secondary intraocular lens with suture, gas injection right eye  Surgeon:  Sherrie George, MD...  Assistant:  Rosalie Doctor SA  Charyl Dancer RN  Anesthesia: General  Specimen: none  Estimated blood loss:  1cc  Complications: none  Patient sent to PACU in good condition  Composed by Sherrie George MD  Dictation number: [id number]

## 2020-11-26 NOTE — Progress Notes (Signed)
No labs per Dr. Donavan Foil.

## 2020-11-27 ENCOUNTER — Encounter (HOSPITAL_COMMUNITY): Payer: Self-pay | Admitting: Ophthalmology

## 2020-11-27 DIAGNOSIS — H43311 Vitreous membranes and strands, right eye: Secondary | ICD-10-CM | POA: Diagnosis not present

## 2020-11-27 DIAGNOSIS — H2701 Aphakia, right eye: Secondary | ICD-10-CM | POA: Diagnosis not present

## 2020-11-27 DIAGNOSIS — Z87891 Personal history of nicotine dependence: Secondary | ICD-10-CM | POA: Diagnosis not present

## 2020-11-27 DIAGNOSIS — E785 Hyperlipidemia, unspecified: Secondary | ICD-10-CM | POA: Diagnosis not present

## 2020-11-27 DIAGNOSIS — Z882 Allergy status to sulfonamides status: Secondary | ICD-10-CM | POA: Diagnosis not present

## 2020-11-27 DIAGNOSIS — Z90710 Acquired absence of both cervix and uterus: Secondary | ICD-10-CM | POA: Diagnosis not present

## 2020-11-27 MED ORDER — PREDNISOLONE ACETATE 1 % OP SUSP: 1.0000 [drp] | Freq: Four times a day (QID) | OPHTHALMIC | 0 refills | Status: AC

## 2020-11-27 MED ORDER — BACITRACIN-POLYMYXIN B 500-10000 UNIT/GM OP OINT: 1.0000 "application " | TOPICAL_OINTMENT | Freq: Three times a day (TID) | OPHTHALMIC | 0 refills | Status: AC

## 2020-11-27 MED ORDER — STERILE WATER FOR INJECTION IJ SOLN
INTRAMUSCULAR | Status: AC
  Administered 2020-11-27: 10 mL
  Filled 2020-11-27: qty 10

## 2020-11-27 MED ORDER — GATIFLOXACIN 0.5 % OP SOLN: 1.0000 [drp] | Freq: Four times a day (QID) | OPHTHALMIC | Status: AC

## 2020-11-27 NOTE — Discharge Summary (Signed)
Discharge summary not needed on [facility] patients per medical records. 

## 2020-11-27 NOTE — Progress Notes (Signed)
Patient taken down to private car via wc with staff at side, all discharge instructions given to patient with questions answered. Patient belongings at side upon discharge.

## 2020-11-27 NOTE — Op Note (Signed)
Tiffany Simpson, Tiffany Simpson MEDICAL RECORD NO: [id number] ACCOUNT NO: [id number] DATE OF BIRTH: 1950/09/02 FACILITY: [facility] LOCATION: [facility] PHYSICIAN: Beulah Gandy. Ashley Royalty, MD  Operative Report   DATE OF PROCEDURE: 11/26/2020  ADMISSION DIAGNOSIS:  Surgical aphakia and vitreous membranes, right eye.  PROCEDURE PERFORMED:  Pars plana vitrectomy, placement of secondary intraocular lens with suture and laser photocoagulation, all in the right eye.  SURGEON:  Alan Mulder, M.D.  ASSISTANT:  Rosalie Doctor, SA  ANESTHESIA:  General.  DESCRIPTION OF PROCEDURE:  Usual prep and drape.  The indirect ophthalmoscope laser was moved into place and 1237 burns were placed around the retinal periphery.  The eye was aphakic and there were weak areas with a special area of break at 5 o'clock.   The power was 400 milliwatts 1000 microns each and 0.1 seconds each.  Attention was then carried to the pars plana area.  A conjunctival peritomy from 8 o'clock to 4 o'clock.  Half-thickness scleral flaps were raised at 3 o'clock and 9 o'clock in  anticipation of IOL suture.  A 3-layered corneal scleral wound was created between 10 o'clock and 2 o'clock.  A 25-gauge trocars were placed at 8, 10 and 2 o'clock.  Provisc was placed on the corneal surface and a pars plana vitrectomy was begun.  The  lighted pick and the cutter were used remove vitreous membranes from the vitreous cavity.  The silicone tipped suction line was drawn down toward the macular surface until the fish strike sign occurred.  The posterior hyaloid was elevated and removed  with the vitreous cutter.  Once the entire vitreous cavity was clear, the attention was then carried externally.  Two Prolene sutures were passed beneath the scleral flaps posterior to the iris in the ciliary sulcus from 3 o'clock to 9 o'clock.  A  docking suture was used to withdraw the sharp needle from the 9 o'clock site.  The two of these Prolene sutures were placed.  The corneal scleral  wound was extended into the anterior chamber and opened with a keratome.  Two Prolene sutures were pulled  through the pupil and out through the corneoscleral wound.  A new intraocular lens was brought onto the field.  This was made by [employer].  Model CZ70BD, power 16.0D, length 12.5 mm, optic 7.0 mm, serial #[id number], expiration date  04/25/2021.  This lens was brought onto the field, inspected and cleaned.  The Prolene sutures were attached to the eyelets of the lens and securely knotted.  The lens was passed through the corneoscleral wound anterior to the iris into the anterior  chamber.  It was then passed behind the iris into the ciliary sulcus.  The Prolene sutures were pulled and withdrawn as the implant was rotated into place.  The Prolene sutures were knotted externally and the scleral flaps were used to cover the Prolene  sutures.  The corneoscleral wound was closed with five interrupted 10-0 nylon sutures.  The wound was tested and the wound was secure.  There was no leakage.  Additional pars plana vitrectomy was performed at this time with Provisc on the corneal surface  and the BIOM viewing system.  All vitreous was cleared.  There was no hemorrhage seen.  A 40% gas-fluid exchange was carried out.  The instruments were removed from the eye and the 25-gauge trocars were removed from the eye.  The 25-gauge wounds were  tested and found to be secure.  Conjunctiva was reposited with 7-0 chromic suture.  Polymyxin and  ceftazidime were rinsed around the globe for antibiotic coverage.  Decadron 10 mg was injected into the lower subconjunctival space.  Marcaine was injected  around the globe for postoperative pain.  The closing pressure was 10 with a Barraquer tonometer.  Polysporin ophthalmic ointment, a patch and shield were placed.  The patient was awakened and taken to recovery in satisfactory condition.      PAA D: 11/26/2020 2:01:52 pm T: 11/27/2020 2:34:00 am  JOB:  [id number]/ [id number]

## 2020-11-27 NOTE — Progress Notes (Signed)
11/27/2020, 6:06 AM  Mental Status:  Awake, Alert, Oriented  Anterior segment: Cornea  Clear    Anterior Chamber Clear    Lens:   Clear, IOL in place  Intra Ocular Pressure 13 mmHg with Tonopen  Vitreous: Clear 40%gas bubble   Retina:  Attached Good laser reaction   Impression: Excellent result Retina attached   Final Diagnosis: Active Problems:   Aphakia, right   Aphakia of eye, right   Plan: start post operative eye drops.  Discharge to home.  Give post operative instructions  Sherrie George 11/27/2020, 6:06 AM

## 2020-12-03 ENCOUNTER — Encounter (INDEPENDENT_AMBULATORY_CARE_PROVIDER_SITE_OTHER): Payer: Medicare Other | Admitting: Ophthalmology

## 2020-12-03 ENCOUNTER — Other Ambulatory Visit: Payer: Self-pay

## 2020-12-03 DIAGNOSIS — H2701 Aphakia, right eye: Secondary | ICD-10-CM

## 2020-12-17 ENCOUNTER — Other Ambulatory Visit: Payer: Self-pay

## 2020-12-17 ENCOUNTER — Encounter (INDEPENDENT_AMBULATORY_CARE_PROVIDER_SITE_OTHER): Payer: Medicare Other | Admitting: Ophthalmology

## 2020-12-17 DIAGNOSIS — H2701 Aphakia, right eye: Secondary | ICD-10-CM

## 2021-01-20 ENCOUNTER — Other Ambulatory Visit: Payer: Self-pay

## 2021-01-20 ENCOUNTER — Encounter (INDEPENDENT_AMBULATORY_CARE_PROVIDER_SITE_OTHER): Payer: Medicare Other | Admitting: Ophthalmology

## 2021-01-20 DIAGNOSIS — H2701 Aphakia, right eye: Secondary | ICD-10-CM

## 2021-03-12 ENCOUNTER — Other Ambulatory Visit: Payer: Self-pay | Admitting: Internal Medicine

## 2021-03-12 DIAGNOSIS — Z1231 Encounter for screening mammogram for malignant neoplasm of breast: Secondary | ICD-10-CM

## 2021-03-28 ENCOUNTER — Ambulatory Visit
Admission: RE | Admit: 2021-03-28 | Discharge: 2021-03-28 | Disposition: A | Discharge status: Home / Self Care | Payer: Medicare Other | Source: Ambulatory Visit | Attending: Internal Medicine | Admitting: Internal Medicine

## 2021-03-28 ENCOUNTER — Other Ambulatory Visit: Payer: Self-pay

## 2021-03-28 DIAGNOSIS — Z1231 Encounter for screening mammogram for malignant neoplasm of breast: Secondary | ICD-10-CM | POA: Diagnosis not present

## 2021-04-01 ENCOUNTER — Other Ambulatory Visit: Payer: Self-pay

## 2021-04-01 ENCOUNTER — Encounter (INDEPENDENT_AMBULATORY_CARE_PROVIDER_SITE_OTHER): Payer: Medicare Other | Admitting: Ophthalmology

## 2021-04-01 DIAGNOSIS — H353121 Nonexudative age-related macular degeneration, left eye, early dry stage: Secondary | ICD-10-CM | POA: Diagnosis not present

## 2021-04-01 DIAGNOSIS — Z961 Presence of intraocular lens: Secondary | ICD-10-CM

## 2021-04-01 DIAGNOSIS — H43812 Vitreous degeneration, left eye: Secondary | ICD-10-CM | POA: Diagnosis not present

## 2021-04-21 DIAGNOSIS — H40033 Anatomical narrow angle, bilateral: Secondary | ICD-10-CM | POA: Diagnosis not present

## 2021-04-21 DIAGNOSIS — H43393 Other vitreous opacities, bilateral: Secondary | ICD-10-CM | POA: Diagnosis not present

## 2021-05-24 DIAGNOSIS — Z23 Encounter for immunization: Secondary | ICD-10-CM | POA: Diagnosis not present

## 2021-06-27 DIAGNOSIS — M810 Age-related osteoporosis without current pathological fracture: Secondary | ICD-10-CM | POA: Diagnosis not present

## 2021-06-27 DIAGNOSIS — E785 Hyperlipidemia, unspecified: Secondary | ICD-10-CM | POA: Diagnosis not present

## 2021-06-27 DIAGNOSIS — I700 Atherosclerosis of aorta: Secondary | ICD-10-CM | POA: Diagnosis not present

## 2021-07-04 DIAGNOSIS — E785 Hyperlipidemia, unspecified: Secondary | ICD-10-CM | POA: Diagnosis not present

## 2021-07-04 DIAGNOSIS — M810 Age-related osteoporosis without current pathological fracture: Secondary | ICD-10-CM | POA: Diagnosis not present

## 2021-07-04 DIAGNOSIS — J449 Chronic obstructive pulmonary disease, unspecified: Secondary | ICD-10-CM | POA: Diagnosis not present

## 2021-07-04 DIAGNOSIS — R030 Elevated blood-pressure reading, without diagnosis of hypertension: Secondary | ICD-10-CM | POA: Diagnosis not present

## 2021-07-04 DIAGNOSIS — K635 Polyp of colon: Secondary | ICD-10-CM | POA: Diagnosis not present

## 2021-07-04 DIAGNOSIS — Z1339 Encounter for screening examination for other mental health and behavioral disorders: Secondary | ICD-10-CM | POA: Diagnosis not present

## 2021-07-04 DIAGNOSIS — Z1331 Encounter for screening for depression: Secondary | ICD-10-CM | POA: Diagnosis not present

## 2021-07-04 DIAGNOSIS — Z0000 Encounter for general adult medical examination without abnormal findings: Secondary | ICD-10-CM | POA: Diagnosis not present

## 2021-07-04 DIAGNOSIS — Z1212 Encounter for screening for malignant neoplasm of rectum: Secondary | ICD-10-CM | POA: Diagnosis not present

## 2021-07-04 DIAGNOSIS — R82998 Other abnormal findings in urine: Secondary | ICD-10-CM | POA: Diagnosis not present

## 2021-07-04 DIAGNOSIS — R8281 Pyuria: Secondary | ICD-10-CM | POA: Diagnosis not present

## 2021-07-04 DIAGNOSIS — I700 Atherosclerosis of aorta: Secondary | ICD-10-CM | POA: Diagnosis not present

## 2021-07-04 DIAGNOSIS — F418 Other specified anxiety disorders: Secondary | ICD-10-CM | POA: Diagnosis not present

## 2021-07-04 DIAGNOSIS — J309 Allergic rhinitis, unspecified: Secondary | ICD-10-CM | POA: Diagnosis not present

## 2022-03-20 ENCOUNTER — Other Ambulatory Visit: Payer: Self-pay | Admitting: Internal Medicine

## 2022-03-20 DIAGNOSIS — Z1231 Encounter for screening mammogram for malignant neoplasm of breast: Secondary | ICD-10-CM

## 2022-03-23 DIAGNOSIS — H40033 Anatomical narrow angle, bilateral: Secondary | ICD-10-CM | POA: Diagnosis not present

## 2022-03-23 DIAGNOSIS — H2513 Age-related nuclear cataract, bilateral: Secondary | ICD-10-CM | POA: Diagnosis not present

## 2022-04-01 ENCOUNTER — Encounter (INDEPENDENT_AMBULATORY_CARE_PROVIDER_SITE_OTHER): Payer: Medicare Other | Admitting: Ophthalmology

## 2022-04-01 DIAGNOSIS — H43812 Vitreous degeneration, left eye: Secondary | ICD-10-CM

## 2022-04-01 DIAGNOSIS — H353121 Nonexudative age-related macular degeneration, left eye, early dry stage: Secondary | ICD-10-CM

## 2022-04-09 ENCOUNTER — Ambulatory Visit
Admission: RE | Admit: 2022-04-09 | Discharge: 2022-04-09 | Disposition: A | Discharge status: Home / Self Care | Payer: Medicare Other | Source: Ambulatory Visit | Attending: Internal Medicine | Admitting: Internal Medicine

## 2022-04-09 DIAGNOSIS — Z1231 Encounter for screening mammogram for malignant neoplasm of breast: Secondary | ICD-10-CM | POA: Diagnosis not present

## 2022-05-02 DIAGNOSIS — Z23 Encounter for immunization: Secondary | ICD-10-CM | POA: Diagnosis not present

## 2022-07-08 DIAGNOSIS — Z1212 Encounter for screening for malignant neoplasm of rectum: Secondary | ICD-10-CM | POA: Diagnosis not present

## 2022-07-08 DIAGNOSIS — I700 Atherosclerosis of aorta: Secondary | ICD-10-CM | POA: Diagnosis not present

## 2022-07-08 DIAGNOSIS — E785 Hyperlipidemia, unspecified: Secondary | ICD-10-CM | POA: Diagnosis not present

## 2022-07-13 DIAGNOSIS — E785 Hyperlipidemia, unspecified: Secondary | ICD-10-CM | POA: Diagnosis not present

## 2022-07-13 DIAGNOSIS — Z1331 Encounter for screening for depression: Secondary | ICD-10-CM | POA: Diagnosis not present

## 2022-07-13 DIAGNOSIS — R030 Elevated blood-pressure reading, without diagnosis of hypertension: Secondary | ICD-10-CM | POA: Diagnosis not present

## 2022-07-13 DIAGNOSIS — F418 Other specified anxiety disorders: Secondary | ICD-10-CM | POA: Diagnosis not present

## 2022-07-13 DIAGNOSIS — I700 Atherosclerosis of aorta: Secondary | ICD-10-CM | POA: Diagnosis not present

## 2022-07-13 DIAGNOSIS — G5600 Carpal tunnel syndrome, unspecified upper limb: Secondary | ICD-10-CM | POA: Diagnosis not present

## 2022-07-13 DIAGNOSIS — M1990 Unspecified osteoarthritis, unspecified site: Secondary | ICD-10-CM | POA: Diagnosis not present

## 2022-07-13 DIAGNOSIS — Z0000 Encounter for general adult medical examination without abnormal findings: Secondary | ICD-10-CM | POA: Diagnosis not present

## 2022-07-13 DIAGNOSIS — M810 Age-related osteoporosis without current pathological fracture: Secondary | ICD-10-CM | POA: Diagnosis not present

## 2022-07-13 DIAGNOSIS — L821 Other seborrheic keratosis: Secondary | ICD-10-CM | POA: Diagnosis not present

## 2022-07-13 DIAGNOSIS — R82998 Other abnormal findings in urine: Secondary | ICD-10-CM | POA: Diagnosis not present

## 2022-07-13 DIAGNOSIS — J449 Chronic obstructive pulmonary disease, unspecified: Secondary | ICD-10-CM | POA: Diagnosis not present

## 2022-08-20 DIAGNOSIS — R030 Elevated blood-pressure reading, without diagnosis of hypertension: Secondary | ICD-10-CM | POA: Diagnosis not present

## 2022-08-20 DIAGNOSIS — F418 Other specified anxiety disorders: Secondary | ICD-10-CM | POA: Diagnosis not present

## 2022-08-20 DIAGNOSIS — J449 Chronic obstructive pulmonary disease, unspecified: Secondary | ICD-10-CM | POA: Diagnosis not present

## 2022-09-24 DIAGNOSIS — C44311 Basal cell carcinoma of skin of nose: Secondary | ICD-10-CM | POA: Diagnosis not present

## 2022-09-24 DIAGNOSIS — L298 Other pruritus: Secondary | ICD-10-CM | POA: Diagnosis not present

## 2022-09-24 DIAGNOSIS — R208 Other disturbances of skin sensation: Secondary | ICD-10-CM | POA: Diagnosis not present

## 2022-09-24 DIAGNOSIS — D485 Neoplasm of uncertain behavior of skin: Secondary | ICD-10-CM | POA: Diagnosis not present

## 2022-09-24 DIAGNOSIS — L538 Other specified erythematous conditions: Secondary | ICD-10-CM | POA: Diagnosis not present

## 2022-09-24 DIAGNOSIS — L820 Inflamed seborrheic keratosis: Secondary | ICD-10-CM | POA: Diagnosis not present

## 2022-09-24 DIAGNOSIS — Z789 Other specified health status: Secondary | ICD-10-CM | POA: Diagnosis not present

## 2022-10-06 DIAGNOSIS — C44319 Basal cell carcinoma of skin of other parts of face: Secondary | ICD-10-CM | POA: Diagnosis not present

## 2023-02-08 DIAGNOSIS — N390 Urinary tract infection, site not specified: Secondary | ICD-10-CM | POA: Diagnosis not present

## 2023-03-12 ENCOUNTER — Other Ambulatory Visit: Payer: Self-pay | Admitting: Internal Medicine

## 2023-03-12 DIAGNOSIS — Z1231 Encounter for screening mammogram for malignant neoplasm of breast: Secondary | ICD-10-CM

## 2023-03-23 DIAGNOSIS — H2513 Age-related nuclear cataract, bilateral: Secondary | ICD-10-CM | POA: Diagnosis not present

## 2023-03-23 DIAGNOSIS — H40033 Anatomical narrow angle, bilateral: Secondary | ICD-10-CM | POA: Diagnosis not present

## 2023-04-14 ENCOUNTER — Ambulatory Visit
Admission: RE | Admit: 2023-04-14 | Discharge: 2023-04-14 | Disposition: A | Discharge status: Home / Self Care | Payer: Medicare Other | Source: Ambulatory Visit | Attending: Internal Medicine | Admitting: Internal Medicine

## 2023-04-14 DIAGNOSIS — Z1231 Encounter for screening mammogram for malignant neoplasm of breast: Secondary | ICD-10-CM

## 2023-04-22 DIAGNOSIS — R5383 Other fatigue: Secondary | ICD-10-CM | POA: Diagnosis not present

## 2023-05-08 DIAGNOSIS — Z23 Encounter for immunization: Secondary | ICD-10-CM | POA: Diagnosis not present

## 2023-05-11 DIAGNOSIS — D485 Neoplasm of uncertain behavior of skin: Secondary | ICD-10-CM | POA: Diagnosis not present

## 2023-05-11 DIAGNOSIS — L308 Other specified dermatitis: Secondary | ICD-10-CM | POA: Diagnosis not present

## 2023-05-11 DIAGNOSIS — L570 Actinic keratosis: Secondary | ICD-10-CM | POA: Diagnosis not present

## 2023-07-08 DIAGNOSIS — E785 Hyperlipidemia, unspecified: Secondary | ICD-10-CM | POA: Diagnosis not present

## 2023-07-08 DIAGNOSIS — M810 Age-related osteoporosis without current pathological fracture: Secondary | ICD-10-CM | POA: Diagnosis not present

## 2023-07-08 DIAGNOSIS — Z1212 Encounter for screening for malignant neoplasm of rectum: Secondary | ICD-10-CM | POA: Diagnosis not present

## 2023-07-15 DIAGNOSIS — G5600 Carpal tunnel syndrome, unspecified upper limb: Secondary | ICD-10-CM | POA: Diagnosis not present

## 2023-07-15 DIAGNOSIS — E785 Hyperlipidemia, unspecified: Secondary | ICD-10-CM | POA: Diagnosis not present

## 2023-07-15 DIAGNOSIS — M1990 Unspecified osteoarthritis, unspecified site: Secondary | ICD-10-CM | POA: Diagnosis not present

## 2023-07-15 DIAGNOSIS — M810 Age-related osteoporosis without current pathological fracture: Secondary | ICD-10-CM | POA: Diagnosis not present

## 2023-07-15 DIAGNOSIS — I10 Essential (primary) hypertension: Secondary | ICD-10-CM | POA: Diagnosis not present

## 2023-07-15 DIAGNOSIS — L821 Other seborrheic keratosis: Secondary | ICD-10-CM | POA: Diagnosis not present

## 2023-07-15 DIAGNOSIS — J449 Chronic obstructive pulmonary disease, unspecified: Secondary | ICD-10-CM | POA: Diagnosis not present

## 2023-07-15 DIAGNOSIS — F418 Other specified anxiety disorders: Secondary | ICD-10-CM | POA: Diagnosis not present

## 2023-07-15 DIAGNOSIS — I700 Atherosclerosis of aorta: Secondary | ICD-10-CM | POA: Diagnosis not present

## 2023-07-15 DIAGNOSIS — R82998 Other abnormal findings in urine: Secondary | ICD-10-CM | POA: Diagnosis not present

## 2023-07-15 DIAGNOSIS — Z0000 Encounter for general adult medical examination without abnormal findings: Secondary | ICD-10-CM | POA: Diagnosis not present

## 2023-07-15 DIAGNOSIS — Z1331 Encounter for screening for depression: Secondary | ICD-10-CM | POA: Diagnosis not present

## 2023-10-08 DIAGNOSIS — J449 Chronic obstructive pulmonary disease, unspecified: Secondary | ICD-10-CM | POA: Diagnosis not present

## 2023-10-08 DIAGNOSIS — I10 Essential (primary) hypertension: Secondary | ICD-10-CM | POA: Diagnosis not present

## 2023-10-08 DIAGNOSIS — J309 Allergic rhinitis, unspecified: Secondary | ICD-10-CM | POA: Diagnosis not present

## 2023-10-08 DIAGNOSIS — R051 Acute cough: Secondary | ICD-10-CM | POA: Diagnosis not present

## 2023-10-08 DIAGNOSIS — Z1152 Encounter for screening for COVID-19: Secondary | ICD-10-CM | POA: Diagnosis not present

## 2023-10-08 DIAGNOSIS — J029 Acute pharyngitis, unspecified: Secondary | ICD-10-CM | POA: Diagnosis not present

## 2023-10-08 DIAGNOSIS — J189 Pneumonia, unspecified organism: Secondary | ICD-10-CM | POA: Diagnosis not present

## 2023-10-08 DIAGNOSIS — R5383 Other fatigue: Secondary | ICD-10-CM | POA: Diagnosis not present

## 2024-01-03 DIAGNOSIS — M25512 Pain in left shoulder: Secondary | ICD-10-CM | POA: Diagnosis not present

## 2024-02-16 DIAGNOSIS — M25512 Pain in left shoulder: Secondary | ICD-10-CM | POA: Diagnosis not present

## 2024-03-06 ENCOUNTER — Other Ambulatory Visit: Payer: Self-pay | Admitting: Internal Medicine

## 2024-03-06 DIAGNOSIS — Z1231 Encounter for screening mammogram for malignant neoplasm of breast: Secondary | ICD-10-CM

## 2024-03-10 DIAGNOSIS — M542 Cervicalgia: Secondary | ICD-10-CM | POA: Diagnosis not present

## 2024-03-24 DIAGNOSIS — M542 Cervicalgia: Secondary | ICD-10-CM | POA: Diagnosis not present

## 2024-03-24 DIAGNOSIS — M25512 Pain in left shoulder: Secondary | ICD-10-CM | POA: Diagnosis not present

## 2024-04-14 ENCOUNTER — Ambulatory Visit
Admission: RE | Admit: 2024-04-14 | Discharge: 2024-04-14 | Disposition: A | Discharge status: Home / Self Care | Source: Ambulatory Visit | Attending: Internal Medicine | Admitting: Internal Medicine

## 2024-04-14 DIAGNOSIS — Z1231 Encounter for screening mammogram for malignant neoplasm of breast: Secondary | ICD-10-CM | POA: Diagnosis not present

## 2024-05-13 DIAGNOSIS — Z23 Encounter for immunization: Secondary | ICD-10-CM | POA: Diagnosis not present

## 2024-05-26 DIAGNOSIS — R300 Dysuria: Secondary | ICD-10-CM | POA: Diagnosis not present

## 2024-05-26 DIAGNOSIS — N390 Urinary tract infection, site not specified: Secondary | ICD-10-CM | POA: Diagnosis not present

## 2024-05-26 DIAGNOSIS — R350 Frequency of micturition: Secondary | ICD-10-CM | POA: Diagnosis not present
